# Patient Record
Sex: Female | Born: 1960 | Hispanic: No | Marital: Married | Smoking: Never smoker
Health system: Southern US, Community
[De-identification: ages and names within clinical notes are randomized; demographics above are authoritative.]

## PROBLEM LIST (undated history)

## (undated) ENCOUNTER — Ambulatory Visit (HOSPITAL_COMMUNITY): Disposition: A | Discharge status: Home / Self Care | Payer: PRIVATE HEALTH INSURANCE

## (undated) DIAGNOSIS — E785 Hyperlipidemia, unspecified: Secondary | ICD-10-CM

## (undated) DIAGNOSIS — I10 Essential (primary) hypertension: Secondary | ICD-10-CM

## (undated) DIAGNOSIS — E119 Type 2 diabetes mellitus without complications: Secondary | ICD-10-CM

## (undated) HISTORY — DX: Hyperlipidemia, unspecified: E78.5

## (undated) HISTORY — DX: Type 2 diabetes mellitus without complications: E11.9

---

## 2002-03-09 HISTORY — PX: ANKLE SURGERY: SHX546

## 2002-06-08 DIAGNOSIS — M810 Age-related osteoporosis without current pathological fracture: Secondary | ICD-10-CM | POA: Insufficient documentation

## 2017-01-27 ENCOUNTER — Ambulatory Visit: Payer: Self-pay | Attending: Nurse Practitioner | Admitting: Nurse Practitioner

## 2017-01-27 ENCOUNTER — Ambulatory Visit: Payer: Self-pay | Admitting: Nurse Practitioner

## 2017-01-27 ENCOUNTER — Encounter: Payer: Self-pay | Admitting: Nurse Practitioner

## 2017-01-27 VITALS — BP 106/71 | HR 66 | Temp 98.3°F | Resp 18 | Ht 68.0 in | Wt 242.0 lb

## 2017-01-27 DIAGNOSIS — E669 Obesity, unspecified: Secondary | ICD-10-CM | POA: Insufficient documentation

## 2017-01-27 DIAGNOSIS — E782 Mixed hyperlipidemia: Secondary | ICD-10-CM | POA: Insufficient documentation

## 2017-01-27 DIAGNOSIS — J309 Allergic rhinitis, unspecified: Secondary | ICD-10-CM | POA: Insufficient documentation

## 2017-01-27 DIAGNOSIS — Z7984 Long term (current) use of oral hypoglycemic drugs: Secondary | ICD-10-CM | POA: Insufficient documentation

## 2017-01-27 DIAGNOSIS — W2203XA Walked into furniture, initial encounter: Secondary | ICD-10-CM | POA: Insufficient documentation

## 2017-01-27 DIAGNOSIS — E119 Type 2 diabetes mellitus without complications: Secondary | ICD-10-CM

## 2017-01-27 DIAGNOSIS — E1169 Type 2 diabetes mellitus with other specified complication: Secondary | ICD-10-CM

## 2017-01-27 DIAGNOSIS — Z79899 Other long term (current) drug therapy: Secondary | ICD-10-CM | POA: Insufficient documentation

## 2017-01-27 DIAGNOSIS — Z833 Family history of diabetes mellitus: Secondary | ICD-10-CM | POA: Insufficient documentation

## 2017-01-27 DIAGNOSIS — I10 Essential (primary) hypertension: Secondary | ICD-10-CM | POA: Insufficient documentation

## 2017-01-27 DIAGNOSIS — Z6836 Body mass index (BMI) 36.0-36.9, adult: Secondary | ICD-10-CM | POA: Insufficient documentation

## 2017-01-27 DIAGNOSIS — E1165 Type 2 diabetes mellitus with hyperglycemia: Secondary | ICD-10-CM | POA: Insufficient documentation

## 2017-01-27 DIAGNOSIS — Z23 Encounter for immunization: Secondary | ICD-10-CM | POA: Insufficient documentation

## 2017-01-27 DIAGNOSIS — M79672 Pain in left foot: Secondary | ICD-10-CM | POA: Insufficient documentation

## 2017-01-27 LAB — POCT GLYCOSYLATED HEMOGLOBIN (HGB A1C): HEMOGLOBIN A1C: 10.2

## 2017-01-27 LAB — GLUCOSE, POCT (MANUAL RESULT ENTRY): POC GLUCOSE: 349 mg/dL — AB (ref 70–99)

## 2017-01-27 MED ORDER — GLIMEPIRIDE 2 MG PO TABS: 2.0000 mg | ORAL_TABLET | Freq: Every day | ORAL | 0 refills | Status: DC

## 2017-01-27 MED ORDER — FLUTICASONE PROPIONATE 50 MCG/ACT NA SUSP: 2.0000 | Freq: Every day | NASAL | 6 refills | Status: DC

## 2017-01-27 MED ORDER — LISINOPRIL 5 MG PO TABS: 5.0000 mg | ORAL_TABLET | Freq: Every day | ORAL | 0 refills | Status: DC

## 2017-01-27 MED ORDER — METFORMIN HCL 1000 MG PO TABS: 1000.0000 mg | ORAL_TABLET | Freq: Two times a day (BID) | ORAL | 2 refills | Status: DC

## 2017-01-27 MED ORDER — LISINOPRIL 5 MG PO TABS: 5.0000 mg | ORAL_TABLET | Freq: Every day | ORAL | 3 refills | Status: DC

## 2017-01-27 MED ORDER — GLIMEPIRIDE 1 MG PO TABS: 1.0000 mg | ORAL_TABLET | Freq: Every day | ORAL | 0 refills | Status: DC

## 2017-01-27 MED FILL — ?METFORMIN HCL 1,000 MG TAB: 1000 | 30 days supply | Qty: 60 | Fill #0

## 2017-01-27 MED FILL — ?LISINOPRIL 5 MG TABLET: 5 | 30 days supply | Qty: 30 | Fill #0

## 2017-01-27 MED FILL — ?GLIMEPIRIDE 2 MG TABLET: 2 | 30 days supply | Qty: 30 | Fill #0

## 2017-01-27 MED FILL — FLUTICASONE PROP 50 MCG SPR: 50 | 30 days supply | Qty: 16 | Fill #0

## 2017-01-27 NOTE — Progress Notes (Signed)
Assessment & Plan:  [name] was seen today for new patient (initial visit).  Diagnoses and all orders for this visit:  Type 2 diabetes mellitus without complication, without long-term current use of insulin (HCC) -     Glucose (CBG) -     HgB A1c -     Ambulatory referral to Ophthalmology -     CBC -     Microalbumin / creatinine urine ratio -     metFORMIN (GLUCOPHAGE) 1000 MG tablet; Take 1 tablet (1,000 mg total) by mouth 2 (two) times daily with a meal. -     glimepiride (AMARYL) 2 MG tablet; Take 1 tablet (2 mg total) by mouth daily with breakfast. Diabetes is poorly controlled. Advised patient to keep a fasting blood sugar log fast, 2 hours post lunch and bedtime which will be reviewed at the next office visit.   Essential hypertension -     CMP14+EGFR -     Microalbumin / creatinine urine ratio -     lisinopril (PRINIVIL,ZESTRIL) 5 MG tablet; Take 1 tablet (5 mg total) by mouth daily. Continue all antihypertensives as prescribed.  Remember to bring in your blood pressure log with you for your follow up appointment.  DASH/Mediterranean Diets are healthier choices for HTN.    Combined hyperlipidemia associated with type 2 diabetes mellitus ([place]) -     Lipid panel Exercise at least 150 minutes per week.   Allergic rhinitis, unspecified seasonality, unspecified trigger -     fluticasone (FLONASE) 50 MCG/ACT nasal spray; Place 2 sprays into both nostrils daily.  Needs flu shot -     Flu Vaccine QUAD 6+ mos PF IM (Fluarix Quad PF)  HEALTH MAINTENANCE Patient has been counseled on age-appropriate routine health concerns for screening and prevention. These are reviewed and up-to-date. Referrals have been placed accordingly. Immunizations are up-to-date or declined.    Ophthalmology referral placed Mammogram referral placed to [facility] Colonoscopy referral made PAP to be scheduled at next office visit Subjective:   Chief Complaint  Patient presents with  . New Patient  (Initial Visit)    Patient would like to establish care. Patient also would like to get medication for diabetes. Patient stated that she had diabetes and hypertension.   Christina Fields 56 y.o. female presents to office today to establish care as a new patient. She is from [place] and has been in the [place] for 4 months. She is accompanied by her husband today.  She endorses a history of DM, HTN and HPL. In regards to HPL she reports she was on medication for her "cholesterol levels" but she stopped taking it after she moved to the [place]. She has been taking '500mg'$  daily of her sister's metformin as she has been out of this as well. She endorses left foot pain(4th toe) today from hitting her foot against the bed several days ago.   Hypertension She is not exercising and is not adherent to low salt diet.  She does not have a blood pressure log today.  Blood pressure is well controlled at home.  Cardiac symptoms lower extremity edema (with prolonged standing). Patient denies chest pain, chest pressure/discomfort, dyspnea, exertional chest pressure/discomfort, irregular heart beat, orthopnea, palpitations and paroxysmal nocturnal dyspnea.  Cardiovascular risk factors: diabetes mellitus, dyslipidemia, hypertension and obesity (BMI >= 30 kg/m2). Use of agents associated with hypertension: none.  History of target organ damage: none. BP Readings from Last 3 Encounters: 01/27/17 : 106/71   Hyperlipidemia She is medication and diet compliant  and denies fatigue and syncope or myalgias.    Diabetes Mellitus Type II Current symptoms/problems include none and have been stable.  Known diabetic complications: none Cardiovascular risk factors: diabetes mellitus, dyslipidemia, hypertension and obesity (BMI >= 30 kg/m2) Current diabetic medications include:  Eye exam current (within one year): no Weight trend: stable Prior visit with dietician: no Current monitoring regimen: none Home blood sugar records: NONE Any  episodes of hypoglycemia? no Awaiting labs  Lab Results      Component                Value               Date                      HGBA1C                   10.2                01/27/2017                   Review of Systems  Constitutional: Negative for fever, malaise/fatigue and weight loss.  HENT: Negative.  Negative for nosebleeds.   Eyes: Negative.  Negative for blurred vision, double vision and photophobia.  Respiratory: Negative.  Negative for cough and shortness of breath.   Cardiovascular: Positive for leg swelling. Negative for chest pain and palpitations.  Gastrointestinal: Negative.  Negative for abdominal pain, constipation, diarrhea, heartburn, nausea and vomiting.  Musculoskeletal: Negative.  Negative for myalgias.  Neurological: Negative.  Negative for dizziness, focal weakness, seizures and headaches.  Endo/Heme/Allergies: Negative for environmental allergies.  Psychiatric/Behavioral: Negative.  Negative for suicidal ideas.    History reviewed. No pertinent past medical history.  Past Surgical History:  Procedure Laterality Date  . ANKLE SURGERY  2004    Family History  Problem Relation Age of Onset  . Diabetes Mother     Social History Reviewed with no changes to be made today.   Outpatient Medications Prior to Visit  Medication Sig Dispense Refill  . glimepiride (AMARYL) 1 MG tablet Take 1 mg by mouth daily with breakfast.    . metFORMIN (GLUCOPHAGE) 1000 MG tablet Take 1,000 mg by mouth 2 (two) times daily with a meal.     No facility-administered medications prior to visit.     Not on File     Objective:    BP 106/71 (BP Location: Left Arm, Patient Position: Sitting, Cuff Size: Normal)   Pulse 66   Temp 98.3 F (36.8 C)   Resp 18   Ht '5\' 8"'$  (1.727 m)   Wt 242 lb (109.8 kg)   SpO2 99%   BMI 36.80 kg/m  Wt Readings from Last 3 Encounters:  01/27/17 242 lb (109.8 kg)    Physical Exam  Constitutional: She is oriented to person,  place, and time. She appears well-developed and well-nourished. She is cooperative.  HENT:  Head: Normocephalic and atraumatic.  Eyes: EOM are normal.  Neck: Normal range of motion.  Cardiovascular: Normal rate, regular rhythm, normal heart sounds and intact distal pulses. Exam reveals no gallop and no friction rub.  No murmur heard. Pulmonary/Chest: Effort normal and breath sounds normal. No tachypnea. No respiratory distress. She has no decreased breath sounds. She has no wheezes. She has no rhonchi. She has no rales. She exhibits no tenderness.  Abdominal: Soft. Bowel sounds are normal.  Musculoskeletal: Normal range of motion. She exhibits  no edema.  Neurological: She is alert and oriented to person, place, and time. Coordination normal.  Skin: Skin is warm and dry.  Psychiatric: She has a normal mood and affect. Her behavior is normal. Judgment and thought content normal.  Nursing note and vitals reviewed.        Patient has been counseled extensively about nutrition and exercise as well as the importance of adherence with medications and regular follow-up. The patient was given clear instructions to go to ER or return to medical center if symptoms don't improve, worsen or new problems develop. The patient verbalized understanding.   Follow-up: Return in about 4 weeks (around 02/24/2017) for HTN/HPL/DM.   Gildardo Pounds, FNP-BC [facility] [place], [place]   01/27/2017, 9:46 PM

## 2017-01-27 NOTE — Patient Instructions (Addendum)
DASH Eating Plan DASH stands for "Dietary Approaches to Stop Hypertension." The DASH eating plan is a healthy eating plan that has been shown to reduce high blood pressure (hypertension). It may also reduce your risk for type 2 diabetes, heart disease, and stroke. The DASH eating plan may also help with weight loss. What are tips for following this plan? General guidelines  Avoid eating more than 2,300 mg (milligrams) of salt (sodium) a day. If you have hypertension, you may need to reduce your sodium intake to 1,500 mg a day.  Limit alcohol intake to no more than 1 drink a day for nonpregnant women and 2 drinks a day for men. One drink equals 12 oz of beer, 5 oz of wine, or 1 oz of hard liquor.  Work with your health care provider to maintain a healthy body weight or to lose weight. Ask what an ideal weight is for you.  Get at least 30 minutes of exercise that causes your heart to beat faster (aerobic exercise) most days of the week. Activities may include walking, swimming, or biking.  Work with your health care provider or diet and nutrition specialist (dietitian) to adjust your eating plan to your individual calorie needs. Reading food labels  Check food labels for the amount of sodium per serving. Choose foods with less than 5 percent of the Daily Value of sodium. Generally, foods with less than 300 mg of sodium per serving fit into this eating plan.  To find whole grains, look for the word "whole" as the first word in the ingredient list. Shopping  Buy products labeled as "low-sodium" or "no salt added."  Buy fresh foods. Avoid canned foods and premade or frozen meals. Cooking  Avoid adding salt when cooking. Use salt-free seasonings or herbs instead of table salt or sea salt. Check with your health care provider or pharmacist before using salt substitutes.  Do not fry foods. Cook foods using healthy methods such as baking, boiling, grilling, and broiling instead.  Cook with  heart-healthy oils, such as olive, canola, soybean, or sunflower oil. Meal planning   Eat a balanced diet that includes: ? 5 or more servings of fruits and vegetables each day. At each meal, try to fill half of your plate with fruits and vegetables. ? Up to 6-8 servings of whole grains each day. ? Less than 6 oz of lean meat, poultry, or fish each day. A 3-oz serving of meat is about the same size as a deck of cards. One egg equals 1 oz. ? 2 servings of low-fat dairy each day. ? A serving of nuts, seeds, or beans 5 times each week. ? Heart-healthy fats. Healthy fats called Omega-3 fatty acids are found in foods such as flaxseeds and coldwater fish, like sardines, salmon, and mackerel.  Limit how much you eat of the following: ? Canned or prepackaged foods. ? Food that is high in trans fat, such as fried foods. ? Food that is high in saturated fat, such as fatty meat. ? Sweets, desserts, sugary drinks, and other foods with added sugar. ? Full-fat dairy products.  Do not salt foods before eating.  Try to eat at least 2 vegetarian meals each week.  Eat more home-cooked food and less restaurant, buffet, and fast food.  When eating at a restaurant, ask that your food be prepared with less salt or no salt, if possible. What foods are recommended? The items listed may not be a complete list. Talk with your dietitian about what   dietary choices are best for you. Grains Whole-grain or whole-wheat bread. Whole-grain or whole-wheat pasta. Brown rice. [place]. Bulgur. Whole-grain and low-sodium cereals. Pita bread. Low-fat, low-sodium crackers. Whole-wheat flour tortillas. Vegetables Fresh or frozen vegetables (raw, steamed, roasted, or grilled). Low-sodium or reduced-sodium tomato and vegetable juice. Low-sodium or reduced-sodium tomato sauce and tomato paste. Low-sodium or reduced-sodium canned vegetables. Fruits All fresh, dried, or frozen fruit. Canned fruit in natural juice (without  added sugar). Meat and other protein foods Skinless chicken or [place]. Ground chicken or [place]. Pork with fat trimmed off. Fish and seafood. Egg whites. Dried beans, peas, or lentils. Unsalted nuts, nut butters, and seeds. Unsalted canned beans. Lean cuts of beef with fat trimmed off. Low-sodium, lean deli meat. Dairy Low-fat (1%) or fat-free (skim) milk. Fat-free, low-fat, or reduced-fat cheeses. Nonfat, low-sodium ricotta or cottage cheese. Low-fat or nonfat yogurt. Low-fat, low-sodium cheese. Fats and oils Soft margarine without trans fats. Vegetable oil. Low-fat, reduced-fat, or light mayonnaise and salad dressings (reduced-sodium). Canola, safflower, olive, soybean, and sunflower oils. Avocado. Seasoning and other foods Herbs. Spices. Seasoning mixes without salt. Unsalted popcorn and pretzels. Fat-free sweets. What foods are not recommended? The items listed may not be a complete list. Talk with your dietitian about what dietary choices are best for you. Grains Baked goods made with fat, such as croissants, muffins, or some breads. Dry pasta or rice meal packs. Vegetables Creamed or fried vegetables. Vegetables in a cheese sauce. Regular canned vegetables (not low-sodium or reduced-sodium). Regular canned tomato sauce and paste (not low-sodium or reduced-sodium). Regular tomato and vegetable juice (not low-sodium or reduced-sodium). Angie Fava. Olives. Fruits Canned fruit in a light or heavy syrup. Fried fruit. Fruit in cream or butter sauce. Meat and other protein foods Fatty cuts of meat. Ribs. Fried meat. Berniece Salines. Sausage. Bologna and other processed lunch meats. Salami. Fatback. Hotdogs. Bratwurst. Salted nuts and seeds. Canned beans with added salt. Canned or smoked fish. Whole eggs or egg yolks. Chicken or [place] with skin. Dairy Whole or 2% milk, cream, and half-and-half. Whole or full-fat cream cheese. Whole-fat or sweetened yogurt. Full-fat cheese. Nondairy creamers. Whipped toppings.  Processed cheese and cheese spreads. Fats and oils Butter. Stick margarine. Lard. Shortening. Ghee. Bacon fat. Tropical oils, such as coconut, palm kernel, or palm oil. Seasoning and other foods Salted popcorn and pretzels. Onion salt, garlic salt, seasoned salt, table salt, and sea salt. Worcestershire sauce. Tartar sauce. Barbecue sauce. Teriyaki sauce. Soy sauce, including reduced-sodium. Steak sauce. Canned and packaged gravies. Fish sauce. Oyster sauce. Cocktail sauce. Horseradish that you find on the shelf. Ketchup. Mustard. Meat flavorings and tenderizers. Bouillon cubes. Hot sauce and Tabasco sauce. Premade or packaged marinades. Premade or packaged taco seasonings. Relishes. Regular salad dressings. Where to find more information:  National Heart, Lung, and [place]: https://wilson-eaton.com/  American Heart Association: www.heart.org Summary  The DASH eating plan is a healthy eating plan that has been shown to reduce high blood pressure (hypertension). It may also reduce your risk for type 2 diabetes, heart disease, and stroke.  With the DASH eating plan, you should limit salt (sodium) intake to 2,300 mg a day. If you have hypertension, you may need to reduce your sodium intake to 1,500 mg a day.  When on the DASH eating plan, aim to eat more fresh fruits and vegetables, whole grains, lean proteins, low-fat dairy, and heart-healthy fats.  Work with your health care provider or diet and nutrition specialist (dietitian) to adjust your eating plan to your individual  calorie needs. This information is not intended to replace advice given to you by your health care provider. Make sure you discuss any questions you have with your health care provider. Document Released: 02/12/2011 Document Revised: 02/17/2016 Document Reviewed: 02/17/2016 Elsevier Interactive Patient Education  2017 [place].  Diabetes Mellitus and Standards of Medical Care Managing diabetes (diabetes mellitus) can be  complicated. Your diabetes treatment may be managed by a team of health care providers, including:  A diet and nutrition specialist (registered dietitian).  A nurse.  A certified diabetes educator (CDE).  A diabetes specialist (endocrinologist).  An eye doctor.  A primary care provider.  A dentist.  Your health care providers follow a schedule in order to help you get the best quality of care. The following schedule is a general guideline for your diabetes management plan. Your health care providers may also give you more specific instructions. HbA1c ( hemoglobin A1c) test This test provides information about blood sugar (glucose) control over the previous 2-3 months. It is used to check whether your diabetes management plan needs to be adjusted.  If you are meeting your treatment goals, this test is done at least 2 times a year.  If you are not meeting treatment goals or if your treatment goals have changed, this test is done 4 times a year.  Blood pressure test  This test is done at every routine medical visit. For most people, the goal is less than 130/80. Ask your health care provider what your goal blood pressure should be. Dental and eye exams  Visit your dentist two times a year.  If you have type 1 diabetes, get an eye exam 3-5 years after you are diagnosed, and then once a year after your first exam. ? If you were diagnosed with type 1 diabetes as a child, get an eye exam when you are age 64 or older and have had diabetes for 3-5 years. After the first exam, you should get an eye exam once a year.  If you have type 2 diabetes, have an eye exam as soon as you are diagnosed, and then once a year after your first exam. Foot care exam  Visual foot exams are done at every routine medical visit. The exams check for cuts, bruises, redness, blisters, sores, or other problems with the feet.  A complete foot exam is done by your health care provider once a year. This exam  includes an inspection of the structure and skin of your feet, and a check of the pulses and sensation in your feet. ? Type 1 diabetes: Get your first exam 3-5 years after diagnosis. ? Type 2 diabetes: Get your first exam as soon as you are diagnosed.  Check your feet every day for cuts, bruises, redness, blisters, or sores. If you have any of these or other problems that are not healing, contact your health care provider. Kidney function test ( urine microalbumin)  This test is done once a year. ? Type 1 diabetes: Get your first test 5 years after diagnosis. ? Type 2 diabetes: Get your first test as soon as you are diagnosed.  If you have chronic kidney disease (CKD), get a serum creatinine and estimated glomerular filtration rate (eGFR) test once a year. Lipid profile (cholesterol, HDL, LDL, triglycerides)  This test should be done when you are diagnosed with diabetes, and every 5 years after the first test. If you are on medicines to lower your cholesterol, you may need to get this test  done every year. ? The goal for LDL is less than 100 mg/dL (5.5 mmol/L). If you are at high risk, the goal is less than 70 mg/dL (3.9 mmol/L). ? The goal for HDL is 40 mg/dL (2.2 mmol/L) for men and 50 mg/dL(2.8 mmol/L) for women. An HDL cholesterol of 60 mg/dL (3.3 mmol/L) or higher gives some protection against heart disease. ? The goal for triglycerides is less than 150 mg/dL (8.3 mmol/L). Immunizations  The yearly flu (influenza) vaccine is recommended for everyone 6 months or older who has diabetes.  The pneumonia (pneumococcal) vaccine is recommended for everyone 2 years or older who has diabetes. If you are 55 or older, you may get the pneumonia vaccine as a series of two separate shots.  The hepatitis B vaccine is recommended for adults shortly after they have been diagnosed with diabetes.  The Tdap (tetanus, diphtheria, and pertussis) vaccine should be given: ? According to normal childhood  vaccination schedules, for children. ? Every 10 years, for adults who have diabetes.  The shingles vaccine is recommended for people who have had chicken pox and are 50 years or older. Mental and emotional health  Screening for symptoms of eating disorders, anxiety, and depression is recommended at the time of diagnosis and afterward as needed. If your screening shows that you have symptoms (you have a positive screening result), you may need further evaluation and be referred to a mental health care provider. Diabetes self-management education  Education about how to manage your diabetes is recommended at diagnosis and ongoing as needed. Treatment plan  Your treatment plan will be reviewed at every medical visit. Summary  Managing diabetes (diabetes mellitus) can be complicated. Your diabetes treatment may be managed by a team of health care providers.  Your health care providers follow a schedule in order to help you get the best quality of care.  Standards of care including having regular physical exams, blood tests, blood pressure monitoring, immunizations, screening tests, and education about how to manage your diabetes.  Your health care providers may also give you more specific instructions based on your individual health. This information is not intended to replace advice given to you by your health care provider. Make sure you discuss any questions you have with your health care provider. Document Released: 12/21/2008 Document Revised: 11/22/2015 Document Reviewed: 11/22/2015 Elsevier Interactive Patient Education  [employer].

## 2017-01-28 LAB — LIPID PANEL
CHOL/HDL RATIO: 4.7 ratio — AB (ref 0.0–4.4)
Cholesterol, Total: 230 mg/dL — ABNORMAL HIGH (ref 100–199)
HDL: 49 mg/dL (ref >39)
LDL Calculated: 128 mg/dL — ABNORMAL HIGH (ref 0–99)
Triglycerides: 263 mg/dL — ABNORMAL HIGH (ref 0–149)
VLDL Cholesterol Cal: 53 mg/dL — ABNORMAL HIGH (ref 5–40)

## 2017-01-28 LAB — CMP14+EGFR
A/G RATIO: 1.3 (ref 1.2–2.2)
ALBUMIN: 4 g/dL (ref 3.5–5.5)
ALT: 10 IU/L (ref 0–32)
AST: 11 IU/L (ref 0–40)
Alkaline Phosphatase: 88 IU/L (ref 39–117)
BILIRUBIN TOTAL: 0.2 mg/dL (ref 0.0–1.2)
BUN / CREAT RATIO: 27 — AB (ref 9–23)
BUN: 17 mg/dL (ref 6–24)
CALCIUM: 9.4 mg/dL (ref 8.7–10.2)
CHLORIDE: 102 mmol/L (ref 96–106)
CO2: 24 mmol/L (ref 20–29)
Creatinine, Ser: 0.62 mg/dL (ref 0.57–1.00)
GFR, EST AFRICAN AMERICAN: 117 mL/min/{1.73_m2} (ref >59)
GFR, EST NON AFRICAN AMERICAN: 101 mL/min/{1.73_m2} (ref >59)
GLOBULIN, TOTAL: 3.2 g/dL (ref 1.5–4.5)
Glucose: 266 mg/dL — ABNORMAL HIGH (ref 65–99)
POTASSIUM: 4.6 mmol/L (ref 3.5–5.2)
SODIUM: 138 mmol/L (ref 134–144)
TOTAL PROTEIN: 7.2 g/dL (ref 6.0–8.5)

## 2017-01-28 LAB — CBC
HEMATOCRIT: 38 % (ref 34.0–46.6)
HEMOGLOBIN: 12.9 g/dL (ref 11.1–15.9)
MCH: 29.1 pg (ref 26.6–33.0)
MCHC: 33.9 g/dL (ref 31.5–35.7)
MCV: 86 fL (ref 79–97)
Platelets: 213 10*3/uL (ref 150–379)
RBC: 4.43 x10E6/uL (ref 3.77–5.28)
RDW: 14.6 % (ref 12.3–15.4)
WBC: 6 10*3/uL (ref 3.4–10.8)

## 2017-01-28 LAB — MICROALBUMIN / CREATININE URINE RATIO
CREATININE, UR: 124.7 mg/dL
MICROALBUM., U, RANDOM: 7 ug/mL
Microalb/Creat Ratio: 5.6 mg/g creat (ref 0.0–30.0)

## 2017-02-01 ENCOUNTER — Other Ambulatory Visit: Payer: Self-pay | Admitting: Nurse Practitioner

## 2017-02-01 MED ORDER — ATORVASTATIN CALCIUM 20 MG PO TABS: 20.0000 mg | ORAL_TABLET | Freq: Every day | ORAL | 3 refills | Status: DC

## 2017-02-02 ENCOUNTER — Telehealth: Payer: Self-pay

## 2017-02-02 NOTE — Telephone Encounter (Signed)
-----   Message from Claiborne RiggZelda W Fleming, NP sent at 02/01/2017  8:30 PM EST ----- Tests show increased cholesterol/lipid levels. Will need to start on statin or cholesterol/lipid lowering medication. Prescription has been sent to the pharmacy. Patient should continue to work on low fat, heart healthy diet and participate in regular aerobic exercise program to control as well. All other labs are essentially normal.

## 2017-02-02 NOTE — Telephone Encounter (Signed)
Patient's cousin answer. He is on her emergency contact list. Patient's cousin is informed on lab result and cholesterol medication needs to be pick up.

## 2017-02-09 ENCOUNTER — Ambulatory Visit: Payer: Self-pay | Attending: Nurse Practitioner

## 2017-02-10 ENCOUNTER — Ambulatory Visit: Payer: Self-pay | Attending: Nurse Practitioner | Admitting: Nurse Practitioner

## 2017-02-10 ENCOUNTER — Encounter: Payer: Self-pay | Admitting: Nurse Practitioner

## 2017-02-10 VITALS — BP 127/82 | HR 81 | Temp 98.2°F | Ht 69.0 in | Wt 239.6 lb

## 2017-02-10 DIAGNOSIS — Z7984 Long term (current) use of oral hypoglycemic drugs: Secondary | ICD-10-CM | POA: Insufficient documentation

## 2017-02-10 DIAGNOSIS — J069 Acute upper respiratory infection, unspecified: Secondary | ICD-10-CM | POA: Insufficient documentation

## 2017-02-10 DIAGNOSIS — E119 Type 2 diabetes mellitus without complications: Secondary | ICD-10-CM | POA: Insufficient documentation

## 2017-02-10 DIAGNOSIS — R509 Fever, unspecified: Secondary | ICD-10-CM | POA: Insufficient documentation

## 2017-02-10 DIAGNOSIS — Z79899 Other long term (current) drug therapy: Secondary | ICD-10-CM | POA: Insufficient documentation

## 2017-02-10 DIAGNOSIS — B070 Plantar wart: Secondary | ICD-10-CM | POA: Insufficient documentation

## 2017-02-10 DIAGNOSIS — R6889 Other general symptoms and signs: Secondary | ICD-10-CM

## 2017-02-10 LAB — POCT URINALYSIS DIPSTICK
Bilirubin, UA: NEGATIVE
Glucose, UA: NEGATIVE
KETONES UA: NEGATIVE
Nitrite, UA: NEGATIVE
PH UA: 5 (ref 5.0–8.0)
Protein, UA: 5
Spec Grav, UA: 1.025 (ref 1.010–1.025)
Urobilinogen, UA: 0.2 E.U./dL

## 2017-02-10 LAB — GLUCOSE, POCT (MANUAL RESULT ENTRY)
POC GLUCOSE: 372 mg/dL — AB (ref 70–99)
POC GLUCOSE: 322 mg/dL — AB (ref 70–99)

## 2017-02-10 MED ORDER — IBUPROFEN 600 MG PO TABS: 600.0000 mg | ORAL_TABLET | Freq: Three times a day (TID) | ORAL | 0 refills | Status: DC | PRN

## 2017-02-10 MED ORDER — ACETAMINOPHEN 500 MG PO TABS: 500.0000 mg | ORAL_TABLET | Freq: Four times a day (QID) | ORAL | 0 refills | Status: DC | PRN

## 2017-02-10 MED ORDER — INSULIN ASPART 100 UNIT/ML ~~LOC~~ SOLN
20.0000 [IU] | Freq: Once | SUBCUTANEOUS | Status: AC
  Administered 2017-02-10: 20 [IU] via SUBCUTANEOUS

## 2017-02-10 MED ORDER — IMIQUIMOD 5 % EX CREA: TOPICAL_CREAM | CUTANEOUS | 1 refills | Status: DC

## 2017-02-10 MED FILL — IBUPROFEN 600 MG TABLET: 600 | 10 days supply | Qty: 30 | Fill #0

## 2017-02-10 NOTE — Progress Notes (Signed)
poct

## 2017-02-10 NOTE — Progress Notes (Signed)
Assessment & Plan:  Christina Fields was seen today for fever.  Diagnoses and all orders for this visit:  Flu-like symptoms -     Respiratory virus panel -     ibuprofen (ADVIL,MOTRIN) 600 MG tablet; Take 1 tablet (600 mg total) by mouth every 8 (eight) hours as needed. -     acetaminophen (TYLENOL) 500 MG tablet; Take 1 tablet (500 mg total) by mouth every 6 (six) hours as needed. INSTRUCTIONS: Stay hydrated and drink plenty of fluids, eat small frequent healthy snacks or meals if able to tolerate.   Plantar wart of right foot -     imiquimod (ALDARA) 5 % cream; Apply topically 3 (three) times a week. -     Ambulatory referral to Podiatry  Type 2 diabetes mellitus without complication, without long-term current use of insulin (HCC) -     Glucose (CBG) -     insulin aspart (novoLOG) injection 20 Units -     Urinalysis Dipstick   Patient has been counseled on age-appropriate routine health concerns for screening and prevention. These are reviewed and up-to-date. Referrals have been placed accordingly. Immunizations are up-to-date or declined.   She will obtain her PNA vaccine at her next office visit.   Subjective:   Chief Complaint  Patient presents with  . Fever    Patient stated shes having headaches, fever, chills, body aches, pain in joints, and nose is stuffy. Patient stated she started to feel these symptoms on Monday.    HPI Christina Fields 56 y.o. female presents to office today with concerns of URI.  Upper Respiratory Infection Patient complains of symptoms of a URI. Her husband was sick for one day. Symptoms include headache, body aches,  congestion, cough, fever and decreased appetite.  . Onset of symptoms was 2 days ago, unchanged since that time. She also c/o achiness and headache described as ache. for the past 2 days .  She is not drinking much. Evaluation to date: none. Treatment to date: "a sedative" but she can not recall the name of it. She has a PMH of poorly  controlled DMII, HTN and HPL. She has not taken her metformin today or yesterday due to illness.  She is hyperglycemic today with no ketonuria.   Review of Systems  Constitutional: Positive for chills, fever and malaise/fatigue.  HENT: Positive for congestion. Negative for ear pain, hearing loss, nosebleeds, sinus pain and sore throat.   Eyes: Negative.   Respiratory: Positive for cough. Negative for hemoptysis, sputum production, shortness of breath and wheezing.   Cardiovascular: Negative.  Negative for chest pain, orthopnea and leg swelling.  Gastrointestinal: Negative for abdominal pain, constipation, diarrhea, heartburn, nausea and vomiting.  Musculoskeletal: Positive for myalgias.  Neurological: Positive for weakness and headaches. Negative for sensory change and focal weakness.  Psychiatric/Behavioral: Negative.     History reviewed. No pertinent past medical history.  Past Surgical History:  Procedure Laterality Date  . ANKLE SURGERY  2004    Family History  Problem Relation Age of Onset  . Diabetes Mother     Social History Reviewed with no changes to be made today.   Outpatient Medications Prior to Visit  Medication Sig Dispense Refill  . atorvastatin (LIPITOR) 20 MG tablet Take 1 tablet (20 mg total) by mouth daily. 90 tablet 3  . fluticasone (FLONASE) 50 MCG/ACT nasal spray Place 2 sprays into both nostrils daily. 16 g 6  . glimepiride (AMARYL) 2 MG tablet Take 1 tablet (2 mg total) by  mouth daily with breakfast. 90 tablet 0  . lisinopril (PRINIVIL,ZESTRIL) 5 MG tablet Take 1 tablet (5 mg total) by mouth daily. 90 tablet 0  . metFORMIN (GLUCOPHAGE) 1000 MG tablet Take 1 tablet (1,000 mg total) by mouth 2 (two) times daily with a meal. 60 tablet 2   No facility-administered medications prior to visit.     No Known Allergies     Objective:    BP 127/82 (BP Location: Right Arm, Patient Position: Sitting, Cuff Size: Normal)   Pulse 81   Temp 98.2 F (36.8 C)  (Oral)   Ht 5\' 9"  (1.753 m)   Wt 239 lb 9.6 oz (108.7 kg)   SpO2 96%   BMI 35.38 kg/m  Wt Readings from Last 3 Encounters:  02/10/17 239 lb 9.6 oz (108.7 kg)  01/27/17 242 lb (109.8 kg)    Physical Exam  Constitutional: She is oriented to person, place, and time. She appears well-developed and well-nourished. She is cooperative. She has a sickly appearance.  HENT:  Head: Normocephalic and atraumatic.  Nose: Mucosal edema and rhinorrhea present. Right sinus exhibits no maxillary sinus tenderness and no frontal sinus tenderness. Left sinus exhibits no maxillary sinus tenderness and no frontal sinus tenderness.  Mouth/Throat: Uvula is midline, oropharynx is clear and moist and mucous membranes are normal. No oropharyngeal exudate, posterior oropharyngeal edema, posterior oropharyngeal erythema or tonsillar abscesses.  Eyes: EOM are normal.  Neck: Normal range of motion.  Cardiovascular: Normal rate, regular rhythm, normal heart sounds and intact distal pulses. Exam reveals no gallop and no friction rub.  No murmur heard. Pulmonary/Chest: Effort normal and breath sounds normal. No tachypnea. No respiratory distress. She has no decreased breath sounds. She has no wheezes. She has no rhonchi. She has no rales. She exhibits no tenderness.  Abdominal: Soft. Bowel sounds are normal.  Musculoskeletal: Normal range of motion. She exhibits no edema.       Feet:  Plantar wart appearing lesions on sole of right foot. Areas are tender with firm palpation. There are no visible signs of infection.   Neurological: She is alert and oriented to person, place, and time. Coordination normal.  Skin: Skin is warm and dry.  Sensory exam of the foot is normal, tested with the monofilament. There are several calloused areas near both heels however no ulcerated lesions observed.  Good peripheral pulses.  Psychiatric: She has a normal mood and affect. Her behavior is normal. Judgment and thought content normal.    Nursing note and vitals reviewed.      Patient has been counseled extensively about nutrition and exercise as well as the importance of adherence with medications and regular follow-up. The patient was given clear instructions to go to ER or return to medical center if symptoms don't improve, worsen or new problems develop. The patient verbalized understanding.   Follow-up: Return if symptoms worsen or fail to improve, for Patient has a follow up appointment scheduled for 3 months.   Claiborne RiggZelda W Fleming, FNP-BC [facility] [place], [place] [phone]   02/10/2017, 10:32 AM

## 2017-02-12 LAB — RESPIRATORY VIRUS PANEL
ADENOVIRUS: NEGATIVE
INFLUENZA A: NEGATIVE
INFLUENZA B 1: NEGATIVE
Metapneumovirus: NEGATIVE
Parainfluenza 1: NEGATIVE
Parainfluenza 2: NEGATIVE
Parainfluenza 3: NEGATIVE
RESPIRATORY SYNCYTIAL VIRUS A: NEGATIVE
RESPIRATORY SYNCYTIAL VIRUS B: NEGATIVE
RHINOVIRUS: NEGATIVE

## 2017-02-17 ENCOUNTER — Telehealth: Payer: Self-pay

## 2017-02-17 NOTE — Telephone Encounter (Signed)
-----   Message from Zelda W Fleming, NP sent at 02/14/2017 12:54 AM EST ----- Labwork is negative for the flu virus 

## 2017-02-17 NOTE — Telephone Encounter (Signed)
Patient informed on the lab result.   Patient verified DOB.

## 2017-02-17 NOTE — Telephone Encounter (Signed)
-----   Message from Claiborne RiggZelda W Fleming, NP sent at 02/14/2017 12:54 AM EST ----- [facility]renceLabwork is negative for the flu virus

## 2017-03-03 MED FILL — ?LISINOPRIL 5 MG TABLET: 5 | 30 days supply | Qty: 30 | Fill #1

## 2017-03-03 MED FILL — ?GLIMEPIRIDE 2 MG TABLET: 2 | 30 days supply | Qty: 30 | Fill #1

## 2017-03-03 MED FILL — ?METFORMIN HCL 1,000 MG TAB: 1000 | 30 days supply | Qty: 60 | Fill #1

## 2017-03-18 ENCOUNTER — Telehealth: Payer: Self-pay | Admitting: Nurse Practitioner

## 2017-03-18 DIAGNOSIS — J309 Allergic rhinitis, unspecified: Secondary | ICD-10-CM

## 2017-03-18 DIAGNOSIS — I10 Essential (primary) hypertension: Secondary | ICD-10-CM

## 2017-03-18 DIAGNOSIS — E119 Type 2 diabetes mellitus without complications: Secondary | ICD-10-CM

## 2017-03-18 MED ORDER — GLIMEPIRIDE 2 MG PO TABS: 2.0000 mg | ORAL_TABLET | Freq: Every day | ORAL | 0 refills | Status: DC

## 2017-03-18 MED ORDER — FLUTICASONE PROPIONATE 50 MCG/ACT NA SUSP: 2.0000 | Freq: Every day | NASAL | 0 refills | Status: DC

## 2017-03-18 MED ORDER — ATORVASTATIN CALCIUM 20 MG PO TABS: 20.0000 mg | ORAL_TABLET | Freq: Every day | ORAL | 0 refills | Status: DC

## 2017-03-18 MED ORDER — METFORMIN HCL 1000 MG PO TABS: 1000.0000 mg | ORAL_TABLET | Freq: Two times a day (BID) | ORAL | 0 refills | Status: DC

## 2017-03-18 MED ORDER — LISINOPRIL 5 MG PO TABS: 5.0000 mg | ORAL_TABLET | Freq: Every day | ORAL | 0 refills | Status: DC

## 2017-03-18 MED FILL — LISINOPRIL 5 MG TAB: 5 | 60 days supply | Qty: 60 | Fill #0

## 2017-03-18 MED FILL — FLUTICASONE PROP 50 MCG SPR: 50 | 30 days supply | Qty: 16 | Fill #0

## 2017-03-18 MED FILL — metFORMIN HCL 1000 MG TABS: 1000 | 60 days supply | Qty: 120 | Fill #0

## 2017-03-18 MED FILL — ?ATORVASTATIN 20MG TABLET: 20 | 30 days supply | Qty: 30 | Fill #0

## 2017-03-18 MED FILL — GLIMEPIRIDE 2 MG TABLET: 2 | 60 days supply | Qty: 60 | Fill #0

## 2017-03-18 NOTE — Telephone Encounter (Signed)
Refilled for 90 day supply 

## 2017-03-18 NOTE — Telephone Encounter (Signed)
Patient requested for a 3 month supply of ALL medications so she able to travel out of country and have medication supply.

## 2017-04-30 ENCOUNTER — Ambulatory Visit: Payer: Self-pay | Admitting: Nurse Practitioner

## 2017-05-28 ENCOUNTER — Telehealth: Payer: Self-pay | Admitting: Nurse Practitioner

## 2017-05-28 ENCOUNTER — Other Ambulatory Visit: Payer: Self-pay | Admitting: Nurse Practitioner

## 2017-05-28 DIAGNOSIS — E119 Type 2 diabetes mellitus without complications: Secondary | ICD-10-CM

## 2017-05-28 DIAGNOSIS — I10 Essential (primary) hypertension: Secondary | ICD-10-CM

## 2017-05-28 MED ORDER — LISINOPRIL 5 MG PO TABS: 5.0000 mg | ORAL_TABLET | Freq: Every day | ORAL | 0 refills | Status: DC

## 2017-05-28 MED ORDER — GLIMEPIRIDE 2 MG PO TABS: 2.0000 mg | ORAL_TABLET | Freq: Every day | ORAL | 0 refills | Status: DC

## 2017-05-28 MED ORDER — METFORMIN HCL 1000 MG PO TABS: 1000.0000 mg | ORAL_TABLET | Freq: Two times a day (BID) | ORAL | 0 refills | Status: DC

## 2017-05-28 MED ORDER — GABAPENTIN 100 MG PO CAPS: 100.0000 mg | ORAL_CAPSULE | Freq: Three times a day (TID) | ORAL | 3 refills | Status: DC

## 2017-05-28 MED FILL — GLIMEPIRIDE 2 MG TABLET: 2 | 30 days supply | Qty: 30 | Fill #1

## 2017-05-28 MED FILL — GABAPENTIN 100 MG CAPSULE: 100 | 30 days supply | Qty: 90 | Fill #0

## 2017-05-28 MED FILL — metFORMIN HCL 1000 MG TABS: 1000 | 30 days supply | Qty: 60 | Fill #1

## 2017-05-28 MED FILL — LISINOPRIL 5 MG TAB: 5 | 30 days supply | Qty: 30 | Fill #1

## 2017-05-28 NOTE — Telephone Encounter (Signed)
Pt ask if she can get Metformin, Lisinopril, Glimepiride refill.  Last seen with PCP: 02/07/17 Next Ov: 07/06/17

## 2017-05-28 NOTE — Telephone Encounter (Signed)
Pt came in to request a refill on her BP medications and [facility]

## 2017-06-03 ENCOUNTER — Other Ambulatory Visit: Payer: Self-pay | Admitting: Nurse Practitioner

## 2017-06-03 NOTE — Telephone Encounter (Signed)
Refill has been sent.  °

## 2017-06-29 MED FILL — ATORVASTATIN 20 MG TABLET: 20 | 30 days supply | Qty: 30 | Fill #1

## 2017-06-29 MED FILL — metFORMIN HCL 1000 MG TABS: 1000 | 90 days supply | Qty: 180 | Fill #0

## 2017-06-29 MED FILL — GABAPENTIN 100 MG CAPSULE: 100 | 30 days supply | Qty: 90 | Fill #1

## 2017-06-29 MED FILL — LISINOPRIL 5 MG TAB: 5 | 90 days supply | Qty: 90 | Fill #0

## 2017-06-29 MED FILL — GLIMEPIRIDE 2 MG TABS: 2 | 90 days supply | Qty: 90 | Fill #0

## 2017-07-06 ENCOUNTER — Encounter: Payer: Self-pay | Admitting: Nurse Practitioner

## 2017-07-06 ENCOUNTER — Ambulatory Visit: Payer: Self-pay | Attending: Nurse Practitioner | Admitting: Nurse Practitioner

## 2017-07-06 VITALS — BP 143/90 | HR 75 | Temp 98.8°F | Resp 14 | Ht 69.0 in | Wt 238.2 lb

## 2017-07-06 DIAGNOSIS — E119 Type 2 diabetes mellitus without complications: Secondary | ICD-10-CM | POA: Insufficient documentation

## 2017-07-06 DIAGNOSIS — Z79899 Other long term (current) drug therapy: Secondary | ICD-10-CM | POA: Insufficient documentation

## 2017-07-06 DIAGNOSIS — Z1211 Encounter for screening for malignant neoplasm of colon: Secondary | ICD-10-CM

## 2017-07-06 DIAGNOSIS — I10 Essential (primary) hypertension: Secondary | ICD-10-CM | POA: Insufficient documentation

## 2017-07-06 DIAGNOSIS — R05 Cough: Secondary | ICD-10-CM | POA: Insufficient documentation

## 2017-07-06 DIAGNOSIS — Z833 Family history of diabetes mellitus: Secondary | ICD-10-CM | POA: Insufficient documentation

## 2017-07-06 DIAGNOSIS — E1169 Type 2 diabetes mellitus with other specified complication: Secondary | ICD-10-CM | POA: Insufficient documentation

## 2017-07-06 DIAGNOSIS — Z760 Encounter for issue of repeat prescription: Secondary | ICD-10-CM | POA: Insufficient documentation

## 2017-07-06 DIAGNOSIS — E782 Mixed hyperlipidemia: Secondary | ICD-10-CM | POA: Insufficient documentation

## 2017-07-06 DIAGNOSIS — Z7984 Long term (current) use of oral hypoglycemic drugs: Secondary | ICD-10-CM | POA: Insufficient documentation

## 2017-07-06 LAB — POCT GLYCOSYLATED HEMOGLOBIN (HGB A1C): Hemoglobin A1C: 6.9

## 2017-07-06 LAB — GLUCOSE, POCT (MANUAL RESULT ENTRY): POC Glucose: 179 mg/dl — AB (ref 70–99)

## 2017-07-06 MED ORDER — BENZONATATE 100 MG PO CAPS: 100.0000 mg | ORAL_CAPSULE | Freq: Two times a day (BID) | ORAL | 0 refills | Status: DC | PRN

## 2017-07-06 MED ORDER — TRUEPLUS LANCETS 28G MISC: 3 refills | Status: DC

## 2017-07-06 MED ORDER — GLUCOSE BLOOD VI STRP: ORAL_STRIP | 12 refills | Status: DC

## 2017-07-06 MED ORDER — TRUE METRIX METER W/DEVICE KIT
PACK | 0 refills | Status: DC
Start: 2017-07-06 — End: 2018-11-22

## 2017-07-06 MED FILL — !TRUE METRIX BLOOD GLUCOSE: 30 days supply | Qty: 1 | Fill #0

## 2017-07-06 MED FILL — TRUEplus LANCETS 28G MISC: 30 days supply | Qty: 100 | Fill #0

## 2017-07-06 MED FILL — TRUE METRIX TEST STRIP: 30 days supply | Qty: 100 | Fill #0

## 2017-07-06 NOTE — Patient Instructions (Signed)
Diabetes blood sugar goals  Fasting in AM before breakfast which means at least 8 hrs of no eating or drinking) except water or unsweetened coffee or tea): 90-110 2 hrs after meals: < 160,   Hypoglycemia or low blood sugar: < 70 (You should not have hypoglycemia.)  Aim for 30 minutes of exercise most days. Rethink what you drink. Water is great! Aim for 2-3 Carb Choices per meal (30-45 grams) +/- 1 either way  Aim for 0-15 Carbs per snack if hungry  Include protein in moderation with your meals and snacks  Consider reading food labels for Total Carbohydrate and Fat Grams of foods  Consider checking BG at alternate times per day  Continue taking medication as directed Be mindful about how much sugar you are adding to beverages and other foods. Fruit Punch - find one with no sugar  Measure and decrease portions of carbohydrate foods  Make your plate and don't go back for seconds 

## 2017-07-06 NOTE — Progress Notes (Signed)
Assessment & Plan:  Christina Fields was seen today for follow-up, medication refill and cough.  Diagnoses and all orders for this visit:  Type 2 diabetes mellitus without complication, without long-term current use of insulin (HCC) -     HgB A1c -     Glucose (CBG) -     glucose blood (TRUE METRIX BLOOD GLUCOSE TEST) test strip; Use as instructed -     TRUEPLUS LANCETS 28G MISC; Use as instructed -     Blood Glucose Monitoring Suppl (TRUE METRIX METER) w/Device KIT; Use as instructed Continue blood sugar control as discussed in office today, low carbohydrate diet, and regular physical exercise as tolerated, 150 minutes per week (30 min each day, 5 days per week, or 50 min 3 days per week). Keep blood sugar logs with fasting goal of 80-130 mg/dl, post prandial less than 180.  For Hypoglycemia: BS <60 and Hyperglycemia BS >400; contact the clinic ASAP. Annual eye exams and foot exams are recommended.   Combined hyperlipidemia associated with type 2 diabetes mellitus ([place]) INSTRUCTIONS: Work on a low fat, heart healthy diet and participate in regular aerobic exercise program by working out at least 150 minutes per week. No fried foods. No junk foods, sodas, sugary drinks, unhealthy snacking, alcohol or smoking.    Essential hypertension Continue all antihypertensives as prescribed.  Remember to bring in your blood pressure log with you for your follow up appointment.  DASH/Mediterranean Diets are healthier choices for HTN.    Colon cancer screening -     Ambulatory referral to Gastroenterology  Other orders -     benzonatate (TESSALON) 100 MG capsule; Take 1 capsule (100 mg total) by mouth 2 (two) times daily as needed for cough.    Patient has been counseled on age-appropriate routine health concerns for screening and prevention. These are reviewed and up-to-date. Referrals have been placed accordingly. Immunizations are up-to-date or declined.    Subjective:   Chief Complaint    Patient presents with  . Follow-up    Patient is here to follow-up on diabetes.   . Medication Refill  . Cough    Pt. think she may have a cold and started coughing for 5 days, face feel congested.    HPI Christina Fields 57 y.o. female presents to office today for follow up for diabetes mellitus type 2, essential hypertension and hyperlipidemia.  She is accompanied by her husband today. VRI was used to communicate directly with patient for the entire encounter including providing detailed patient instructions.   Diabetes Mellitus Type 2 Chronic and much improved. A1c down from 10.2 to 6.9. She has been medication compliant compared to non adherence with daily medication administration in the past. She does not have a glucometer. Will order for her today. She is eating more vegetables and less fat as well as losing weight. She denies any hypo or hyperglycemic symptoms. She is due for eye exam.  Lab Results  Component Value Date   HGBA1C 6.9 07/06/2017     Essential Hypertension Chronic. Normally well controlled however she is experiencing cold symptoms with persistent cough over the past several days and blood pressure is slightly elevated today. Will not make any changes to lisinopril at this time. Denies chest pain, shortness of breath, palpitations, lightheadedness, dizziness, headaches or BLE edema.  BP Readings from Last 3 Encounters:  07/06/17 (!) 143/90  02/10/17 127/82  01/27/17 106/71   Cough Patient complains of productive cough (yellowish), congestion (nasal at night), runny nose,  itchy throatSymptoms began 6 days ago.  The cough is without wheezing, dyspnea or hemoptysis and is aggravated by  heat .  Patient does not have new pets. Patient does not have a history of asthma. Patient does not have a history of environmental allergens. Patient did not have recent travel. Patient does not have a history of smoking. Patient  does not have previous Chest X-ray. Patient does not have  had a PPD done. She has been using herbal teas which provide moderate relief of cough.    Review of Systems  Constitutional: Negative for fever, malaise/fatigue and weight loss.  HENT: Positive for congestion, hearing loss (bilateral (chronic)) and sore throat. Negative for nosebleeds.        SEE HPI  Eyes: Negative.  Negative for blurred vision, double vision and photophobia.  Respiratory: Positive for cough and sputum production. Negative for shortness of breath.   Cardiovascular: Negative.  Negative for chest pain, palpitations and leg swelling.  Gastrointestinal: Negative.  Negative for heartburn, nausea and vomiting.  Musculoskeletal: Negative.  Negative for myalgias.  Neurological: Negative.  Negative for dizziness, focal weakness, seizures and headaches.  Psychiatric/Behavioral: Negative.  Negative for suicidal ideas.    Past Medical History:  Diagnosis Date  . Diabetes mellitus without complication [facility])     Past Surgical History:  Procedure Laterality Date  . ANKLE SURGERY  2004    Family History  Problem Relation Age of Onset  . Diabetes Mother     Social History Reviewed with no changes to be made today.   Outpatient Medications Prior to Visit  Medication Sig Dispense Refill  . atorvastatin (LIPITOR) 20 MG tablet Take 1 tablet (20 mg total) by mouth daily. 90 tablet 0  . glimepiride (AMARYL) 2 MG tablet Take 1 tablet (2 mg total) by mouth daily with breakfast. 90 tablet 0  . ibuprofen (ADVIL,MOTRIN) 600 MG tablet Take 1 tablet (600 mg total) by mouth every 8 (eight) hours as needed. 30 tablet 0  . lisinopril (PRINIVIL,ZESTRIL) 5 MG tablet Take 1 tablet (5 mg total) by mouth daily. 90 tablet 0  . acetaminophen (TYLENOL) 500 MG tablet Take 1 tablet (500 mg total) by mouth every 6 (six) hours as needed. (Patient not taking: Reported on 07/06/2017) 30 tablet 0  . fluticasone (FLONASE) 50 MCG/ACT nasal spray Place 2 sprays into both nostrils daily. (Patient not taking:  Reported on 07/06/2017) 48 g 0  . gabapentin (NEURONTIN) 100 MG capsule Take 1 capsule (100 mg total) by mouth 3 (three) times daily. (Patient not taking: Reported on 07/06/2017) 90 capsule 3  . imiquimod (ALDARA) 5 % cream Apply topically 3 (three) times a week. (Patient not taking: Reported on 07/06/2017) 12 each 1  . metFORMIN (GLUCOPHAGE) 1000 MG tablet Take 1 tablet (1,000 mg total) by mouth 2 (two) times daily with a meal. 180 tablet 0   No facility-administered medications prior to visit.     No Known Allergies     Objective:    BP (!) 143/90 (BP Location: Left Arm, Patient Position: Sitting, Cuff Size: Large)   Pulse 75   Temp 98.8 F (37.1 C) (Oral)   Resp 14   Ht '5\' 9"'$  (1.753 m)   Wt 238 lb 3.2 oz (108 kg)   SpO2 99%   BMI 35.18 kg/m  Wt Readings from Last 3 Encounters:  07/06/17 238 lb 3.2 oz (108 kg)  02/10/17 239 lb 9.6 oz (108.7 kg)  01/27/17 242 lb (109.8 kg)  Physical Exam  Constitutional: She is oriented to person, place, and time. She appears well-developed and well-nourished. She is cooperative.  HENT:  Head: Normocephalic and atraumatic.  Right Ear: Decreased hearing is noted.  Left Ear: Decreased hearing is noted.  Nose: Mucosal edema and rhinorrhea present. Right sinus exhibits no maxillary sinus tenderness and no frontal sinus tenderness. Left sinus exhibits no maxillary sinus tenderness and no frontal sinus tenderness.  Eyes: EOM are normal.  Neck: Normal range of motion.  Cardiovascular: Normal rate, regular rhythm, normal heart sounds and intact distal pulses. Exam reveals no gallop and no friction rub.  No murmur heard. Pulmonary/Chest: Effort normal and breath sounds normal. No tachypnea. No respiratory distress. She has no decreased breath sounds. She has no wheezes. She has no rhonchi. She has no rales. She exhibits no tenderness.  Abdominal: Soft. Bowel sounds are normal.  Musculoskeletal: Normal range of motion. She exhibits no edema.    Neurological: She is alert and oriented to person, place, and time. Coordination normal.  Skin: Skin is warm and dry.  Psychiatric: She has a normal mood and affect. Her behavior is normal. Judgment and thought content normal.  Nursing note and vitals reviewed.        Patient has been counseled extensively about nutrition and exercise as well as the importance of adherence with medications and regular follow-up. The patient was given clear instructions to go to ER or return to medical center if symptoms don't improve, worsen or new problems develop. The patient verbalized understanding.   Follow-up: Return in about 3 months (around 10/05/2017) for HTN/HPL/DM.   Gildardo Pounds, FNP-BC [facility] [place], [place]   07/06/2017, 1:00 PM

## 2017-07-07 ENCOUNTER — Ambulatory Visit: Payer: Self-pay | Admitting: Nurse Practitioner

## 2017-07-20 ENCOUNTER — Ambulatory Visit: Payer: Self-pay | Attending: Family Medicine

## 2017-08-30 ENCOUNTER — Encounter: Payer: Self-pay | Admitting: Nurse Practitioner

## 2017-08-30 ENCOUNTER — Ambulatory Visit: Payer: Self-pay | Attending: Nurse Practitioner | Admitting: Nurse Practitioner

## 2017-08-30 VITALS — BP 131/84 | HR 62 | Temp 98.3°F | Ht 69.0 in | Wt 238.0 lb

## 2017-08-30 DIAGNOSIS — M79642 Pain in left hand: Secondary | ICD-10-CM

## 2017-08-30 DIAGNOSIS — Z7951 Long term (current) use of inhaled steroids: Secondary | ICD-10-CM | POA: Insufficient documentation

## 2017-08-30 DIAGNOSIS — Z79899 Other long term (current) drug therapy: Secondary | ICD-10-CM | POA: Insufficient documentation

## 2017-08-30 DIAGNOSIS — E119 Type 2 diabetes mellitus without complications: Secondary | ICD-10-CM | POA: Insufficient documentation

## 2017-08-30 DIAGNOSIS — Z7984 Long term (current) use of oral hypoglycemic drugs: Secondary | ICD-10-CM | POA: Insufficient documentation

## 2017-08-30 DIAGNOSIS — M7642 Tibial collateral bursitis [Pellegrini-Stieda], left leg: Secondary | ICD-10-CM | POA: Insufficient documentation

## 2017-08-30 DIAGNOSIS — M25532 Pain in left wrist: Secondary | ICD-10-CM | POA: Insufficient documentation

## 2017-08-30 LAB — GLUCOSE, POCT (MANUAL RESULT ENTRY): POC GLUCOSE: 178 mg/dL — AB (ref 70–99)

## 2017-08-30 MED ORDER — GLIMEPIRIDE 2 MG PO TABS: 2.0000 mg | ORAL_TABLET | Freq: Every day | ORAL | 0 refills | Status: DC

## 2017-08-30 MED ORDER — METFORMIN HCL 1000 MG PO TABS
1000.0000 mg | ORAL_TABLET | Freq: Two times a day (BID) | ORAL | 0 refills | Status: DC
Start: 2017-08-30 — End: 2018-03-28

## 2017-08-30 MED ORDER — NAPROXEN 500 MG PO TABS: 500.0000 mg | ORAL_TABLET | Freq: Two times a day (BID) | ORAL | 1 refills | Status: DC

## 2017-08-30 MED FILL — NAPROXEN 500 MG TABLET: 500 | 30 days supply | Qty: 60 | Fill #0

## 2017-08-30 MED FILL — ?ATORVASTATIN 20 MG TABLET: 20 | 30 days supply | Qty: 30 | Fill #2

## 2017-08-30 NOTE — Patient Instructions (Addendum)
Patient has a xray ordered of her left wrist. Please show her how to get to radiology.   Hand Pain Many things can cause hand pain. Some common causes are:  An injury.  Repeating the same movement with your hand over and over (overuse).  Osteoporosis.  Arthritis.  Lumps in the tendons or joints of the hand and wrist (ganglion cysts).  Infection.  Follow these instructions at home: Pay attention to any changes in your symptoms. Take these actions to help with your discomfort:  If directed, put ice on the affected area: ? Put ice in a plastic bag. ? Place a towel between your skin and the bag. ? Leave the ice on for 15-20 minutes, 3?4 times a day for 2 days.  Take over-the-counter and prescription medicines only as told by your health care provider.  Minimize stress on your hands and wrists as much as possible.  Take breaks from repetitive activity often.  Do stretches as told by your health care provider.  Do not do activities that make your pain worse.  Contact a health care provider if:  Your pain does not get better after a few days of self-care.  Your pain gets worse.  Your pain affects your ability to do your daily activities. Get help right away if:  Your hand becomes warm, red, or swollen.  Your hand is numb or tingling.  Your hand is extremely swollen or deformed.  Your hand or fingers turn white or blue.  You cannot move your hand, wrist, or fingers. This information is not intended to replace advice given to you by your health care provider. Make sure you discuss any questions you have with your health care provider. Document Released: 03/22/2015 Document Revised: 08/01/2015 Document Reviewed: 03/21/2014 Elsevier Interactive Patient Education  [employer]2018 [employer]

## 2017-08-30 NOTE — Progress Notes (Signed)
Assessment & Plan:  Christina Fields was seen today for wrist pain.  Diagnoses and all orders for this visit:  Left hand pain -     DG Wrist Complete Left; Future -     Uric Acid -     naproxen (NAPROSYN) 500 MG tablet; Take 1 tablet (500 mg total) by mouth 2 (two) times daily with a meal. Splint wrist. Apply ice to affected area. May alternate with tylenol for pain.   Type 2 diabetes mellitus without complication, without long-term current use of insulin (HCC) -     Glucose (CBG) Continue blood sugar control as discussed in office today, low carbohydrate diet, and regular physical exercise as tolerated, 150 minutes per week (30 min each day, 5 days per week, or 50 min 3 days per week). Keep blood sugar logs with fasting goal of 80-130 mg/dl, post prandial less than 180.  For Hypoglycemia: BS <60 and Hyperglycemia BS >400; contact the clinic ASAP. Annual eye exams and foot exams are recommended.     Patient has been counseled on age-appropriate routine health concerns for screening and prevention. These are reviewed and up-to-date. Referrals have been placed accordingly. Immunizations are up-to-date or declined.    Subjective:   Chief Complaint  Patient presents with  . Wrist Pain    Pt. stated her left wrist pain hurts with movements. Pt. stated it has been going on for months but lately it hurts more.    VRI was used to communicate directly with patient for the entire encounter including providing detailed patient instructions.   Wrist Pain   The pain is present in the left wrist (and left thumb). This is a recurrent problem. The current episode started more than 1 month ago. There has been no history of extremity trauma. The problem occurs constantly. The problem has been gradually worsening. The quality of the pain is described as dull. The pain is moderate. Associated symptoms include joint swelling and a limited range of motion. Pertinent negatives include no fever. Exacerbated by:  pain is worse when she uses her left thumb. She has tried NSAIDS for the symptoms. The treatment provided no relief. Her past medical history is significant for diabetes. There is no history of gout, osteoarthritis or rheumatoid arthritis.    Review of Systems  Constitutional: Negative for fever, malaise/fatigue and weight loss.  HENT: Negative.  Negative for nosebleeds.   Eyes: Negative.  Negative for blurred vision, double vision and photophobia.  Respiratory: Negative.  Negative for cough and shortness of breath.   Cardiovascular: Negative.  Negative for chest pain, palpitations and leg swelling.  Gastrointestinal: Negative.  Negative for heartburn, nausea and vomiting.  Musculoskeletal: Positive for joint pain (and swelling of left wrist). Negative for gout and myalgias.  Neurological: Negative.  Negative for dizziness, focal weakness, seizures and headaches.  Psychiatric/Behavioral: Negative.  Negative for suicidal ideas.    Past Medical History:  Diagnosis Date  . Diabetes mellitus without complication [facility])     Past Surgical History:  Procedure Laterality Date  . ANKLE SURGERY  2004    Family History  Problem Relation Age of Onset  . Diabetes Mother     Social History Reviewed with no changes to be made today.   Outpatient Medications Prior to Visit  Medication Sig Dispense Refill  . atorvastatin (LIPITOR) 20 MG tablet Take 1 tablet (20 mg total) by mouth daily. 90 tablet 0  . Blood Glucose Monitoring Suppl (TRUE METRIX METER) w/Device KIT Use as instructed  1 kit 0  . glucose blood (TRUE METRIX BLOOD GLUCOSE TEST) test strip Use as instructed 100 each 12  . lisinopril (PRINIVIL,ZESTRIL) 5 MG tablet Take 1 tablet (5 mg total) by mouth daily. 90 tablet 0  . acetaminophen (TYLENOL) 500 MG tablet Take 1 tablet (500 mg total) by mouth every 6 (six) hours as needed. (Patient not taking: Reported on 07/06/2017) 30 tablet 0  . benzonatate (TESSALON) 100 MG capsule Take 1 capsule  (100 mg total) by mouth 2 (two) times daily as needed for cough. (Patient not taking: Reported on 08/30/2017) 20 capsule 0  . fluticasone (FLONASE) 50 MCG/ACT nasal spray Place 2 sprays into both nostrils daily. (Patient not taking: Reported on 07/06/2017) 48 g 0  . gabapentin (NEURONTIN) 100 MG capsule Take 1 capsule (100 mg total) by mouth 3 (three) times daily. (Patient not taking: Reported on 07/06/2017) 90 capsule 3  . ibuprofen (ADVIL,MOTRIN) 600 MG tablet Take 1 tablet (600 mg total) by mouth every 8 (eight) hours as needed. (Patient not taking: Reported on 08/30/2017) 30 tablet 0  . imiquimod (ALDARA) 5 % cream Apply topically 3 (three) times a week. (Patient not taking: Reported on 07/06/2017) 12 each 1  . TRUEPLUS LANCETS 28G MISC Use as instructed 100 each 3  . glimepiride (AMARYL) 2 MG tablet Take 1 tablet (2 mg total) by mouth daily with breakfast. 90 tablet 0  . metFORMIN (GLUCOPHAGE) 1000 MG tablet Take 1 tablet (1,000 mg total) by mouth 2 (two) times daily with a meal. 180 tablet 0   No facility-administered medications prior to visit.     No Known Allergies     Objective:    BP 131/84 (BP Location: Left Arm, Patient Position: Sitting, Cuff Size: Large)   Pulse 62   Temp 98.3 F (36.8 C) (Oral)   Ht '5\' 9"'$  (1.753 m)   Wt 238 lb (108 kg)   SpO2 100%   BMI 35.15 kg/m  Wt Readings from Last 3 Encounters:  08/30/17 238 lb (108 kg)  07/06/17 238 lb 3.2 oz (108 kg)  02/10/17 239 lb 9.6 oz (108.7 kg)    Physical Exam  Constitutional: She is oriented to person, place, and time. She appears well-developed and well-nourished. She is cooperative.  HENT:  Head: Normocephalic and atraumatic.  Eyes: EOM are normal.  Neck: Normal range of motion.  Cardiovascular: Normal rate, regular rhythm and normal heart sounds. Exam reveals no gallop and no friction rub.  No murmur heard. Pulmonary/Chest: Effort normal and breath sounds normal. No tachypnea. No respiratory distress. She has no  decreased breath sounds. She has no wheezes. She has no rhonchi. She has no rales. She exhibits no tenderness.  Musculoskeletal: She exhibits edema, tenderness and deformity.       Left wrist: She exhibits decreased range of motion, tenderness, swelling and deformity. She exhibits no bony tenderness and no laceration.  Pain is elicited with bilateral grip strength test as well bilateral push pull command.   Neurological: She is alert and oriented to person, place, and time. Coordination normal.  Skin: Skin is warm and dry.  Psychiatric: She has a normal mood and affect. Her behavior is normal. Judgment and thought content normal.  Nursing note and vitals reviewed.      Patient has been counseled extensively about nutrition and exercise as well as the importance of adherence with medications and regular follow-up. The patient was given clear instructions to go to ER or return to medical center if symptoms  don't improve, worsen or new problems develop. The patient verbalized understanding.   Follow-up: Return in about 3 weeks (around 09/20/2017) for left hand pain.   Gildardo Pounds, FNP-BC [facility] and [place], [place]   08/30/2017, 2:20 PM

## 2017-08-31 ENCOUNTER — Ambulatory Visit (HOSPITAL_COMMUNITY)
Admission: RE | Admit: 2017-08-31 | Discharge: 2017-08-31 | Disposition: A | Discharge status: Home / Self Care | Payer: Self-pay | Source: Ambulatory Visit | Attending: Nurse Practitioner | Admitting: Nurse Practitioner

## 2017-08-31 DIAGNOSIS — M79642 Pain in left hand: Secondary | ICD-10-CM | POA: Insufficient documentation

## 2017-08-31 LAB — URIC ACID: Uric Acid: 4.2 mg/dL (ref 2.5–7.1)

## 2017-09-07 ENCOUNTER — Telehealth: Payer: Self-pay

## 2017-09-07 NOTE — Telephone Encounter (Signed)
-----   Message from Claiborne RiggZelda W Fleming, NP sent at 09/05/2017 12:56 PM EDT ----- Left wrist xray is normal. Will follow up at next office visit.

## 2017-09-07 NOTE — Telephone Encounter (Signed)
CMA attempt to call patient to inform on X-ray results. No answer and left a VM for patient to call back.  If patient call back, please inform:  Left wrist xray is normal. Will follow up at next office visit.

## 2017-09-24 ENCOUNTER — Other Ambulatory Visit: Payer: Self-pay | Admitting: Nurse Practitioner

## 2017-09-24 MED FILL — LISINOPRIL 5 MG TAB: 5 | 30 days supply | Qty: 30 | Fill #2

## 2017-09-24 MED FILL — metFORMIN HCL 1000 MG TABS: 1000 | 30 days supply | Qty: 60 | Fill #0

## 2017-09-24 MED FILL — GLIMEPIRIDE 2 MG TABS: 2 | 30 days supply | Qty: 30 | Fill #2

## 2017-09-24 MED FILL — ?ATORVASTATIN 20 MG TABLET: 20 | 30 days supply | Qty: 30 | Fill #0

## 2017-10-05 ENCOUNTER — Ambulatory Visit: Payer: Self-pay | Admitting: Nurse Practitioner

## 2017-10-29 ENCOUNTER — Other Ambulatory Visit: Payer: Self-pay | Admitting: Nurse Practitioner

## 2017-10-29 DIAGNOSIS — I10 Essential (primary) hypertension: Secondary | ICD-10-CM

## 2017-10-29 MED FILL — ?METFORMIN HCL 1000MG TABS: 1000 | 30 days supply | Qty: 60 | Fill #1

## 2017-10-29 MED FILL — LISINOPRIL 5 MG TAB: 5 | 30 days supply | Qty: 30 | Fill #0

## 2017-10-29 MED FILL — GLIMEPIRIDE 2 MG TABS: 2 | 30 days supply | Qty: 30 | Fill #0

## 2017-10-29 MED FILL — ?ATORVASTATIN 20 MG TABLET: 20 | 30 days supply | Qty: 30 | Fill #1

## 2017-11-05 ENCOUNTER — Ambulatory Visit: Payer: Self-pay | Attending: Family Medicine

## 2017-11-30 MED FILL — ?METFORMIN HCL 1000MG TABS: 1000 | 30 days supply | Qty: 60 | Fill #2

## 2017-11-30 MED FILL — ?ATORVASTATIN 20 MG TABLET: 20 | 30 days supply | Qty: 30 | Fill #2

## 2017-11-30 MED FILL — LISINOPRIL 5 MG TAB: 5 | 30 days supply | Qty: 30 | Fill #1

## 2017-11-30 MED FILL — GLIMEPIRIDE 2 MG TABS: 2 | 30 days supply | Qty: 30 | Fill #1

## 2017-12-08 ENCOUNTER — Ambulatory Visit: Payer: Self-pay | Admitting: Nurse Practitioner

## 2017-12-13 ENCOUNTER — Ambulatory Visit: Payer: Self-pay | Admitting: Nurse Practitioner

## 2017-12-24 ENCOUNTER — Ambulatory Visit: Payer: Self-pay | Admitting: Nurse Practitioner

## 2018-01-12 ENCOUNTER — Other Ambulatory Visit: Payer: Self-pay | Admitting: Nurse Practitioner

## 2018-01-12 MED FILL — GLIMEPIRIDE 2 MG TABS: 2 | 30 days supply | Qty: 30 | Fill #2

## 2018-01-12 MED FILL — LISINOPRIL 5 MG TABLET: 5 | 30 days supply | Qty: 30 | Fill #2

## 2018-01-12 MED FILL — ?METFORMIN HCL 1000MG TABS: 1000 | 30 days supply | Qty: 60 | Fill #2

## 2018-02-14 ENCOUNTER — Other Ambulatory Visit: Payer: Self-pay | Admitting: Nurse Practitioner

## 2018-02-14 DIAGNOSIS — E119 Type 2 diabetes mellitus without complications: Secondary | ICD-10-CM

## 2018-02-14 DIAGNOSIS — I10 Essential (primary) hypertension: Secondary | ICD-10-CM

## 2018-02-14 MED FILL — ?METFORMIN HCL 1,000 MG TAB: 1000 | 30 days supply | Qty: 60 | Fill #0

## 2018-02-14 MED FILL — ?ATORVASTATIN 20 MG TABLET: 20 | 30 days supply | Qty: 30 | Fill #0

## 2018-02-14 MED FILL — GLIMEPIRIDE 2 MG TABS: 2 | 30 days supply | Qty: 30 | Fill #0

## 2018-02-14 MED FILL — LISINOPRIL 5 MG TAB: 5 | 30 days supply | Qty: 30 | Fill #0

## 2018-03-28 ENCOUNTER — Other Ambulatory Visit: Payer: Self-pay | Admitting: Nurse Practitioner

## 2018-03-28 DIAGNOSIS — I10 Essential (primary) hypertension: Secondary | ICD-10-CM

## 2018-03-28 DIAGNOSIS — E119 Type 2 diabetes mellitus without complications: Secondary | ICD-10-CM

## 2018-03-28 MED ORDER — METFORMIN HCL 1000 MG PO TABS: 1000.0000 mg | ORAL_TABLET | Freq: Two times a day (BID) | ORAL | 0 refills | Status: DC

## 2018-03-28 MED ORDER — ATORVASTATIN CALCIUM 20 MG PO TABS: 20.0000 mg | ORAL_TABLET | Freq: Every day | ORAL | 0 refills | Status: DC

## 2018-03-28 MED ORDER — GLIMEPIRIDE 2 MG PO TABS: 2.0000 mg | ORAL_TABLET | Freq: Every day | ORAL | 0 refills | Status: DC

## 2018-03-28 MED ORDER — LISINOPRIL 5 MG PO TABS: 5.0000 mg | ORAL_TABLET | Freq: Every day | ORAL | 0 refills | Status: DC

## 2018-03-28 MED FILL — GLIMEPIRIDE 2 MG TABS: 2 | 30 days supply | Qty: 30 | Fill #0

## 2018-03-28 MED FILL — LISINOPRIL 5 MG TAB: 5 | 30 days supply | Qty: 30 | Fill #0

## 2018-03-28 MED FILL — ?ATORVASTATIN 20 MG TABLET: 20 | 30 days supply | Qty: 30 | Fill #0

## 2018-03-28 MED FILL — ?METFORMIN HCL 1000MG TABS: 1000 | 30 days supply | Qty: 60 | Fill #0

## 2018-05-10 ENCOUNTER — Other Ambulatory Visit: Payer: Self-pay | Admitting: Nurse Practitioner

## 2018-05-10 DIAGNOSIS — E119 Type 2 diabetes mellitus without complications: Secondary | ICD-10-CM

## 2018-05-10 DIAGNOSIS — I10 Essential (primary) hypertension: Secondary | ICD-10-CM

## 2018-05-18 ENCOUNTER — Other Ambulatory Visit: Payer: Self-pay

## 2018-05-18 ENCOUNTER — Ambulatory Visit: Payer: Self-pay | Attending: Nurse Practitioner | Admitting: Physician Assistant

## 2018-05-18 ENCOUNTER — Ambulatory Visit: Payer: Self-pay | Attending: Family Medicine

## 2018-05-18 VITALS — BP 127/85 | HR 57 | Temp 98.1°F | Resp 16 | Wt 253.8 lb

## 2018-05-18 DIAGNOSIS — I10 Essential (primary) hypertension: Secondary | ICD-10-CM

## 2018-05-18 DIAGNOSIS — E119 Type 2 diabetes mellitus without complications: Secondary | ICD-10-CM

## 2018-05-18 DIAGNOSIS — R52 Pain, unspecified: Secondary | ICD-10-CM

## 2018-05-18 DIAGNOSIS — E1169 Type 2 diabetes mellitus with other specified complication: Secondary | ICD-10-CM

## 2018-05-18 DIAGNOSIS — E782 Mixed hyperlipidemia: Secondary | ICD-10-CM

## 2018-05-18 LAB — POCT GLYCOSYLATED HEMOGLOBIN (HGB A1C): HbA1c, POC (controlled diabetic range): 7.6 % — AB (ref 0.0–7.0)

## 2018-05-18 LAB — GLUCOSE, POCT (MANUAL RESULT ENTRY): POC Glucose: 113 mg/dl — AB (ref 70–99)

## 2018-05-18 MED ORDER — LISINOPRIL 5 MG PO TABS: 5.0000 mg | ORAL_TABLET | Freq: Every day | ORAL | 5 refills | Status: DC

## 2018-05-18 MED ORDER — GLIMEPIRIDE 4 MG PO TABS: 4.0000 mg | ORAL_TABLET | Freq: Every day | ORAL | 3 refills | Status: DC

## 2018-05-18 MED ORDER — ATORVASTATIN CALCIUM 20 MG PO TABS: 20.0000 mg | ORAL_TABLET | Freq: Every day | ORAL | 3 refills | Status: DC

## 2018-05-18 MED ORDER — METFORMIN HCL 1000 MG PO TABS: 1000.0000 mg | ORAL_TABLET | Freq: Two times a day (BID) | ORAL | 5 refills | Status: DC

## 2018-05-18 MED ORDER — NAPROXEN 500 MG PO TABS: 500.0000 mg | ORAL_TABLET | Freq: Two times a day (BID) | ORAL | 1 refills | Status: DC

## 2018-05-18 MED FILL — NAPROXEN 500 MG TABLET: 500 | 30 days supply | Qty: 60 | Fill #0

## 2018-05-18 MED FILL — LISINOPRIL 5 MG TAB: 5 | 30 days supply | Qty: 30 | Fill #0

## 2018-05-18 MED FILL — ATORVASTATIN 20 MG TABLET: 20 | 30 days supply | Qty: 30 | Fill #0

## 2018-05-18 MED FILL — ?METFORMIN HCL 1000 MG TAB: 1000 | 30 days supply | Qty: 60 | Fill #0

## 2018-05-18 MED FILL — ?GLIMEPIRIDE 4 MG TABLET: 4 | 30 days supply | Qty: 30 | Fill #0

## 2018-05-18 NOTE — Patient Instructions (Signed)
Drink 80-100 ounces water daily 

## 2018-05-18 NOTE — Progress Notes (Signed)
Pt states she is having pain through out her joints

## 2018-05-18 NOTE — Progress Notes (Signed)
Patient ID: Christina Fields, female   DOB: 10-13-1960, 58 y.o.   MRN: [id number]   Christina Fields, is a 58 y.o. female  IEP:[id number]  YSA:[id number]  DOB - [birth year]  Subjective:  Chief Complaint and HPI: Christina Fields is a 58 y.o. female here today for med check and body aches.  All over body aches for over 1 month.  Ibuprofen gives relief for about an hour.  No tick bites.  No recent travel.  No fever.  No joint swelling.    Not checking blood sugars.  Not following diabetic diet.  Out of all meds about 7-10 days.  No symptoms hyper/hypoglycemia.    Compliant with BP and cholesterol meds but out for about a week.  Patient is non-fasting.    ROS:   Constitutional:  No f/c, No night sweats, No unexplained weight loss. EENT:  No vision changes, No blurry vision, No hearing changes. No mouth, throat, or ear problems.  Respiratory: No cough, No SOB Cardiac: No CP, no palpitations GI:  No abd pain, No N/V/D. GU: No Urinary s/sx Musculoskeletal: +bodyaches.   Neuro: No headache, no dizziness, no motor weakness.  Skin: No rash Endocrine:  No polydipsia. No polyuria.  Psych: Denies SI/HI  No problems updated.  ALLERGIES: No Known Allergies  PAST MEDICAL HISTORY: Past Medical History:  Diagnosis Date  . Diabetes mellitus without complication ([place])     MEDICATIONS AT HOME: Prior to Admission medications   Medication Sig Start Date End Date Taking? Authorizing Provider  acetaminophen (TYLENOL) 500 MG tablet Take 1 tablet (500 mg total) by mouth every 6 (six) hours as needed. Patient not taking: Reported on 07/06/2017 02/10/17   Gildardo Pounds, NP  atorvastatin (LIPITOR) 20 MG tablet Take 1 tablet (20 mg total) by mouth daily. 05/18/18   Argentina Donovan, PA-C  Blood Glucose Monitoring Suppl (TRUE METRIX METER) w/Device KIT Use as instructed 07/06/17   Gildardo Pounds, NP  fluticasone [facility]) 50 MCG/ACT nasal spray Place 2 sprays into both nostrils daily. Patient not taking:  Reported on 07/06/2017 03/18/17   Gildardo Pounds, NP  glimepiride (AMARYL) 4 MG tablet Take 1 tablet (4 mg total) by mouth daily before breakfast. 05/18/18   Argentina Donovan, PA-C  glucose blood (TRUE METRIX BLOOD GLUCOSE TEST) test strip Use as instructed 07/06/17   Gildardo Pounds, NP  ibuprofen (ADVIL,MOTRIN) 600 MG tablet Take 1 tablet (600 mg total) by mouth every 8 (eight) hours as needed. Patient not taking: Reported on 08/30/2017 02/10/17   Gildardo Pounds, NP  imiquimod [place]) 5 % cream Apply topically 3 (three) times a week. Patient not taking: Reported on 07/06/2017 02/10/17   Gildardo Pounds, NP  lisinopril (PRINIVIL,ZESTRIL) 5 MG tablet Take 1 tablet (5 mg total) by mouth daily. 05/18/18   Argentina Donovan, PA-C  metFORMIN (GLUCOPHAGE) 1000 MG tablet Take 1 tablet (1,000 mg total) by mouth 2 (two) times daily with a meal. 05/18/18   Maley Venezia, Dionne Bucy, PA-C  naproxen (NAPROSYN) 500 MG tablet Take 1 tablet (500 mg total) by mouth 2 (two) times daily with a meal. Prn pain 05/18/18   Argentina Donovan, PA-C  TRUEPLUS LANCETS 28G MISC Use as instructed 07/06/17   Gildardo Pounds, NP     Objective:  EXAM:   Vitals:   05/18/18 1440  BP: 127/85  Pulse: (!) 57  Resp: 16  Temp: 98.1 F (36.7 C)  TempSrc: Oral  SpO2: 100%  Weight: 253 lb  12.8 oz (115.1 kg)    General appearance : A&OX3. NAD. Non-toxic-appearing HEENT: Atraumatic and Normocephalic.  PERRLA. EOM intact.   Neck: supple, no JVD. No cervical lymphadenopathy. No thyromegaly Chest/Lungs:  Breathing-non-labored, Good air entry bilaterally, breath sounds normal without rales, rhonchi, or wheezing  CVS: S1 S2 regular, no murmurs, gallops, rubs  Extremities: Bilateral Lower Ext shows no edema, both legs are warm to touch with = pulse throughout Neurology:  CN II-XII grossly intact, Non focal.   Psych:  TP linear. J/I WNL. Normal speech. Appropriate eye contact and affect.  Skin:  No Rash  Data Review Lab Results   Component Value Date   HGBA1C 7.6 (A) 05/18/2018   HGBA1C 6.9 07/06/2017   HGBA1C 10.2 01/27/2017     Assessment & Plan   1. Body aches - TSH - Vitamin D, 25-hydroxy - naproxen (NAPROSYN) 500 MG tablet; Take 1 tablet (500 mg total) by mouth 2 (two) times daily with a meal. Prn pain  Dispense: 60 tablet; Refill: 1  2. Type 2 diabetes mellitus without complication, without long-term current use of insulin (HCC) Uncontrolled-increase amaryl dose.  Diabetic diet encouraged.   - Glucose (CBG) - HgB A1c - TSH - Comprehensive metabolic panel - glimepiride (AMARYL) 4 MG tablet; Take 1 tablet (4 mg total) by mouth daily before breakfast.  Dispense: 30 tablet; Refill: 3 - metFORMIN (GLUCOPHAGE) 1000 MG tablet; Take 1 tablet (1,000 mg total) by mouth 2 (two) times daily with a meal.  Dispense: 60 tablet; Refill: 5  3. Essential hypertension Controlled-continue current regimen - TSH - lisinopril (PRINIVIL,ZESTRIL) 5 MG tablet; Take 1 tablet (5 mg total) by mouth daily.  Dispense: 30 tablet; Refill: 5  4. Combined hyperlipidemia associated with type 2 diabetes mellitus (HCC) - atorvastatin (LIPITOR) 20 MG tablet; Take 1 tablet (20 mg total) by mouth daily.  Dispense: 30 tablet; Refill: 3  Patient have been counseled extensively about nutrition and exercise  Return in about 3 months (around 08/18/2018) for Zelda for DM and htn and fasting labs.  The patient was given clear instructions to go to ER or return to medical center if symptoms don't improve, worsen or new problems develop. The patient verbalized understanding. The patient was told to call to get lab results if they haven't heard anything in the next week.     Freeman Caldron, PA-C [facility] and [place], [place]   05/18/2018, 3:08 PM

## 2018-05-19 ENCOUNTER — Other Ambulatory Visit: Payer: Self-pay | Admitting: Physician Assistant

## 2018-05-19 LAB — COMPREHENSIVE METABOLIC PANEL
ALT: 12 IU/L (ref 0–32)
AST: 14 IU/L (ref 0–40)
Albumin/Globulin Ratio: 1.3 (ref 1.2–2.2)
Albumin: 4 g/dL (ref 3.8–4.9)
Alkaline Phosphatase: 72 IU/L (ref 39–117)
BUN/Creatinine Ratio: 23 (ref 9–23)
BUN: 18 mg/dL (ref 6–24)
Bilirubin Total: <0.2 mg/dL (ref 0.0–1.2)
CO2: 23 mmol/L (ref 20–29)
Calcium: 9.6 mg/dL (ref 8.7–10.2)
Chloride: 103 mmol/L (ref 96–106)
Creatinine, Ser: 0.79 mg/dL (ref 0.57–1.00)
GFR calc Af Amer: 95 mL/min/{1.73_m2} (ref >59)
GFR calc non Af Amer: 83 mL/min/{1.73_m2} (ref >59)
Globulin, Total: 3.2 g/dL (ref 1.5–4.5)
Glucose: 94 mg/dL (ref 65–99)
Potassium: 4.5 mmol/L (ref 3.5–5.2)
Sodium: 142 mmol/L (ref 134–144)
Total Protein: 7.2 g/dL (ref 6.0–8.5)

## 2018-05-19 LAB — TSH: TSH: 1.51 u[IU]/mL (ref 0.450–4.500)

## 2018-05-19 LAB — VITAMIN D 25 HYDROXY (VIT D DEFICIENCY, FRACTURES): VIT D 25 HYDROXY: 21.1 ng/mL — AB (ref 30.0–100.0)

## 2018-05-19 MED ORDER — VITAMIN D (ERGOCALCIFEROL) 1.25 MG (50000 UNIT) PO CAPS: 50000.0000 [IU] | ORAL_CAPSULE | ORAL | 0 refills | Status: DC

## 2018-05-20 MED FILL — VIT D2 1.25 MG (50,000 UNIT: 1.25 MG | 84 days supply | Qty: 12 | Fill #0

## 2018-05-23 ENCOUNTER — Telehealth: Payer: Self-pay | Admitting: Nurse Practitioner

## 2018-05-23 NOTE — Telephone Encounter (Signed)
Called pt in regards to her Financial assistance application, she is aware that to finalize her application her complete bank statements are needed from the past 3 months. She only provided the transaction history. She had no questions or concerns and agreed to the same deadline 05/25/2018.

## 2018-05-24 ENCOUNTER — Telehealth: Payer: Self-pay

## 2018-05-24 NOTE — Telephone Encounter (Signed)
[employer] Amy  Id# [id number]  contacted pt to go over lab results pt is aware and doesn't have any questions or concerns   

## 2018-05-25 ENCOUNTER — Ambulatory Visit: Payer: Self-pay

## 2018-06-17 MED FILL — ?METFORMIN HCL 1000 MG TAB: 1000 | 30 days supply | Qty: 60 | Fill #1

## 2018-06-17 MED FILL — LISINOPRIL 5 MG TAB: 5 | 30 days supply | Qty: 30 | Fill #1

## 2018-06-17 MED FILL — ?ATORVASTATIN 20 MG TABLET: 20 | 30 days supply | Qty: 30 | Fill #1

## 2018-06-17 MED FILL — GLIMEPIRIDE 4 MG TABS: 4 | 30 days supply | Qty: 30 | Fill #1

## 2018-07-25 MED FILL — GLIMEPIRIDE 4 MG TABS: 4 | 30 days supply | Qty: 30 | Fill #2

## 2018-07-25 MED FILL — ?METFORMIN HCL 1000 MG TAB: 1000 | 30 days supply | Qty: 60 | Fill #2

## 2018-07-25 MED FILL — LISINOPRIL 5 MG TAB: 5 | 30 days supply | Qty: 30 | Fill #2

## 2018-07-25 MED FILL — ?ATORVASTATIN 20 MG TABLET: 20 | 30 days supply | Qty: 30 | Fill #2

## 2018-08-22 ENCOUNTER — Ambulatory Visit: Payer: BLUE CROSS/BLUE SHIELD | Attending: Nurse Practitioner | Admitting: Nurse Practitioner

## 2018-08-22 ENCOUNTER — Other Ambulatory Visit: Payer: Self-pay

## 2018-08-22 ENCOUNTER — Encounter: Payer: Self-pay | Admitting: Nurse Practitioner

## 2018-08-22 VITALS — BP 112/70 | HR 72 | Temp 98.5°F | Ht 69.0 in | Wt 251.8 lb

## 2018-08-22 DIAGNOSIS — Z1211 Encounter for screening for malignant neoplasm of colon: Secondary | ICD-10-CM

## 2018-08-22 DIAGNOSIS — Z114 Encounter for screening for human immunodeficiency virus [HIV]: Secondary | ICD-10-CM

## 2018-08-22 DIAGNOSIS — E782 Mixed hyperlipidemia: Secondary | ICD-10-CM

## 2018-08-22 DIAGNOSIS — E1165 Type 2 diabetes mellitus with hyperglycemia: Secondary | ICD-10-CM | POA: Diagnosis not present

## 2018-08-22 DIAGNOSIS — M791 Myalgia, unspecified site: Secondary | ICD-10-CM

## 2018-08-22 DIAGNOSIS — L819 Disorder of pigmentation, unspecified: Secondary | ICD-10-CM

## 2018-08-22 DIAGNOSIS — Z1159 Encounter for screening for other viral diseases: Secondary | ICD-10-CM

## 2018-08-22 DIAGNOSIS — E559 Vitamin D deficiency, unspecified: Secondary | ICD-10-CM

## 2018-08-22 LAB — POCT GLYCOSYLATED HEMOGLOBIN (HGB A1C): Hemoglobin A1C: 7.8 % — AB (ref 4.0–5.6)

## 2018-08-22 LAB — GLUCOSE, POCT (MANUAL RESULT ENTRY): POC Glucose: 243 mg/dl — AB (ref 70–99)

## 2018-08-22 MED ORDER — CYCLOBENZAPRINE HCL 5 MG PO TABS: 5.0000 mg | ORAL_TABLET | Freq: Three times a day (TID) | ORAL | 1 refills | Status: DC | PRN

## 2018-08-22 MED ORDER — IBUPROFEN 600 MG PO TABS: 600.0000 mg | ORAL_TABLET | Freq: Three times a day (TID) | ORAL | 0 refills | Status: DC | PRN

## 2018-08-22 MED ORDER — GLIMEPIRIDE 4 MG PO TABS: 8.0000 mg | ORAL_TABLET | Freq: Every day | ORAL | 3 refills | Status: DC

## 2018-08-22 MED FILL — IBUPROFEN 600 MG TABLET: 600 | 20 days supply | Qty: 60 | Fill #0

## 2018-08-22 NOTE — Progress Notes (Signed)
Assessment & Plan:  Palm was seen today for follow-up and pain.  Diagnoses and all orders for this visit:  Type 2 diabetes mellitus with hyperglycemia, without long-term current use of insulin (HCC) -     Glucose (CBG) -     HgB A1c -     CBC -     Basic metabolic panel -     Ambulatory referral to Ophthalmology -     glimepiride (AMARYL) 4 MG tablet; Take 2 tablets (8 mg total) by mouth daily before breakfast. Diabetes is poorly controlled. Advised patient to keep a fasting blood sugar log fast, 2 hours post lunch and bedtime which will be reviewed at the next office visit. Continue blood sugar control as discussed in office today, low carbohydrate diet, and regular physical exercise as tolerated, 150 minutes per week (30 min each day, 5 days per week, or 50 min 3 days per week). Keep blood sugar logs with fasting goal of 90-130 mg/dl, post prandial (after you eat) less than 180.  For Hypoglycemia: BS <60 and Hyperglycemia BS >400; contact the clinic ASAP. Annual eye exams and foot exams are recommended.  Mixed hyperlipidemia -     Lipid panel INSTRUCTIONS: Work on a low fat, heart healthy diet and participate in regular aerobic exercise program by working out at least 150 minutes per week; 5 days a week-30 minutes per day. Avoid red meat, fried foods. junk foods, sodas, sugary drinks, unhealthy snacking, alcohol and smoking.  Drink at least 48oz of water per day and monitor your carbohydrate intake daily.   Encounter for hepatitis C screening test for low risk patient -     Hepatitis C Antibody  Colon cancer screening -     Fecal occult blood, imunochemical([facility])  Encounter for screening for HIV -     HIV antibody (with reflex)  Vitamin D deficiency disease -     VITAMIN D 25 Hydroxy (Vit-D Deficiency, Fractures)  Hyperpigmented skin lesion -     Ambulatory referral to Dermatology  Myalgia -     cyclobenzaprine (FLEXERIL) 5 MG tablet; Take 1 tablet (5 mg  total) by mouth 3 (three) times daily as needed for muscle spasms. -     ibuprofen (ADVIL) 600 MG tablet; Take 1 tablet (600 mg total) by mouth every 8 (eight) hours as needed.    Patient has been counseled on age-appropriate routine health concerns for screening and prevention. These are reviewed and up-to-date. Referrals have been placed accordingly. Immunizations are up-to-date or declined.    Subjective:   Chief Complaint  Patient presents with  . Follow-up    Pt. is here to follow up on diabetes.   . Pain    Pt. stated she is having pain on her left arm.   HPI Christina Fields 58 y.o. female presents to office today for follow up.  has a past medical history of Diabetes mellitus without complication ([place]) and Hyperlipidemia.    DM TYPE 2 Metformin 1000 mg BID, glimepiride 4 mg daily. On ACE and statin. Blood pressure well controlled. She endorses medication compliance. Weight is stable. She denies any hypo or hyperglycemic symptoms. She does not routinely check her blood glucose levels at home. Diet is not consistently adherent with diabetes. She is obese. Not exercising.  Lab Results  Component Value Date   HGBA1C 7.8 (A) 08/22/2018   Lab Results  Component Value Date   HGBA1C 7.6 (A) 05/18/2018   BP Readings from Last 3 Encounters:  08/22/18 112/70  05/18/18 127/85  08/30/17 131/84    Mixed Hyperlipidemia LDL not at goal. She endorses non adherence with taking atorvastatin 20 mg daily. Denies any statin intolerance. Fasting lipid panel pending.  Lab Results  Component Value Date   LDLCALC 128 (H) 01/27/2017   Review of Systems  Constitutional: Negative for fever, malaise/fatigue and weight loss.  HENT: Negative.  Negative for nosebleeds.   Eyes: Negative.  Negative for blurred vision, double vision and photophobia.  Respiratory: Negative.  Negative for cough and shortness of breath.   Cardiovascular: Negative.  Negative for chest pain, palpitations and leg  swelling.  Gastrointestinal: Negative.  Negative for heartburn, nausea and vomiting.  Musculoskeletal: Positive for myalgias (left arm).  Neurological: Negative.  Negative for dizziness, focal weakness, seizures and headaches.  Psychiatric/Behavioral: Negative.  Negative for suicidal ideas.    Past Medical History:  Diagnosis Date  . Diabetes mellitus without complication ([place])   . Hyperlipidemia     Past Surgical History:  Procedure Laterality Date  . ANKLE SURGERY  2004    Family History  Problem Relation Age of Onset  . Diabetes Mother     Social History Reviewed with no changes to be made today.   Outpatient Medications Prior to Visit  Medication Sig Dispense Refill  . atorvastatin (LIPITOR) 20 MG tablet Take 1 tablet (20 mg total) by mouth daily. 30 tablet 3  . metFORMIN (GLUCOPHAGE) 1000 MG tablet Take 1 tablet (1,000 mg total) by mouth 2 (two) times daily with a meal. 60 tablet 5  . glimepiride (AMARYL) 4 MG tablet Take 1 tablet (4 mg total) by mouth daily before breakfast. 30 tablet 3  . acetaminophen (TYLENOL) 500 MG tablet Take 1 tablet (500 mg total) by mouth every 6 (six) hours as needed. (Patient not taking: Reported on 07/06/2017) 30 tablet 0  . Blood Glucose Monitoring Suppl (TRUE METRIX METER) w/Device KIT Use as instructed (Patient not taking: Reported on 08/22/2018) 1 kit 0  . glucose blood (TRUE METRIX BLOOD GLUCOSE TEST) test strip Use as instructed (Patient not taking: Reported on 08/22/2018) 100 each 12  . lisinopril (PRINIVIL,ZESTRIL) 5 MG tablet Take 1 tablet (5 mg total) by mouth daily. 30 tablet 5  . naproxen (NAPROSYN) 500 MG tablet Take 1 tablet (500 mg total) by mouth 2 (two) times daily with a meal. Prn pain (Patient not taking: Reported on 08/22/2018) 60 tablet 1  . TRUEPLUS LANCETS 28G MISC Use as instructed (Patient not taking: Reported on 08/22/2018) 100 each 3  . Vitamin D, Ergocalciferol, (DRISDOL) 1.25 MG (50000 UT) CAPS capsule Take 1 capsule  (50,000 Units total) by mouth every 7 (seven) days. 12 capsule 0  . fluticasone (FLONASE) 50 MCG/ACT nasal spray Place 2 sprays into both nostrils daily. (Patient not taking: Reported on 07/06/2017) 48 g 0  . ibuprofen (ADVIL,MOTRIN) 600 MG tablet Take 1 tablet (600 mg total) by mouth every 8 (eight) hours as needed. (Patient not taking: Reported on 08/30/2017) 30 tablet 0  . imiquimod (ALDARA) 5 % cream Apply topically 3 (three) times a week. (Patient not taking: Reported on 07/06/2017) 12 each 1   No facility-administered medications prior to visit.     No Known Allergies     Objective:    BP 112/70 (BP Location: Left Arm, Patient Position: Sitting, Cuff Size: Large)   Pulse 72   Temp 98.5 F (36.9 C) (Oral)   Ht '5\' 9"'$  (1.753 m)   Wt 251 lb 12.8 oz (114.2  kg)   SpO2 98%   BMI 37.18 kg/m  Wt Readings from Last 3 Encounters:  08/22/18 251 lb 12.8 oz (114.2 kg)  05/18/18 253 lb 12.8 oz (115.1 kg)  08/30/17 238 lb (108 kg)    Physical Exam Vitals signs and nursing note reviewed.  Constitutional:      Appearance: She is well-developed.  HENT:     Head: Normocephalic and atraumatic.  Neck:     Musculoskeletal: Normal range of motion.  Cardiovascular:     Rate and Rhythm: Normal rate and regular rhythm.     Pulses:          Dorsalis pedis pulses are 2+ on the right side and 2+ on the left side.       Posterior tibial pulses are 2+ on the right side and 2+ on the left side.     Heart sounds: Normal heart sounds. No murmur. No friction rub. No gallop.   Pulmonary:     Effort: Pulmonary effort is normal. No tachypnea or respiratory distress.     Breath sounds: Normal breath sounds. No decreased breath sounds, wheezing, rhonchi or rales.  Chest:     Chest wall: No tenderness.  Abdominal:     General: Bowel sounds are normal.     Palpations: Abdomen is soft.  Musculoskeletal: Normal range of motion.     Left upper arm: She exhibits tenderness. She exhibits no bony tenderness and  no swelling.       Arms:       Feet:  Feet:     Right foot:     Protective Sensation: 10 sites tested. 10 sites sensed.     Skin integrity: No skin breakdown, erythema, callus or dry skin.     Left foot:     Protective Sensation: 10 sites tested. 10 sites sensed.     Skin integrity: No skin breakdown, erythema, callus or dry skin.  Skin:    General: Skin is warm and dry.  Neurological:     Mental Status: She is alert and oriented to person, place, and time.     Coordination: Coordination normal.  Psychiatric:        Behavior: Behavior normal. Behavior is cooperative.        Thought Content: Thought content normal.        Judgment: Judgment normal.          Patient has been counseled extensively about nutrition and exercise as well as the importance of adherence with medications and regular follow-up. The patient was given clear instructions to go to ER or return to medical center if symptoms don't improve, worsen or new problems develop. The patient verbalized understanding.   Follow-up: Return in about 3 months (around 11/22/2018) for 8 weeks for PAP and 3 months for DM, HPL.   Gildardo Pounds, FNP-BC [facility] and [place], [place]   08/22/2018, 9:29 AM

## 2018-08-23 LAB — LIPID PANEL
Chol/HDL Ratio: 4.4 ratio (ref 0.0–4.4)
Cholesterol, Total: 210 mg/dL — ABNORMAL HIGH (ref 100–199)
HDL: 48 mg/dL (ref >39)
LDL Calculated: 102 mg/dL — ABNORMAL HIGH (ref 0–99)
Triglycerides: 302 mg/dL — ABNORMAL HIGH (ref 0–149)
VLDL Cholesterol Cal: 60 mg/dL — ABNORMAL HIGH (ref 5–40)

## 2018-08-23 LAB — BASIC METABOLIC PANEL
BUN/Creatinine Ratio: 23 (ref 9–23)
BUN: 16 mg/dL (ref 6–24)
CO2: 16 mmol/L — ABNORMAL LOW (ref 20–29)
Calcium: 9.2 mg/dL (ref 8.7–10.2)
Chloride: 103 mmol/L (ref 96–106)
Creatinine, Ser: 0.7 mg/dL (ref 0.57–1.00)
GFR calc Af Amer: 110 mL/min/{1.73_m2} (ref >59)
GFR calc non Af Amer: 96 mL/min/{1.73_m2} (ref >59)
Glucose: 246 mg/dL — ABNORMAL HIGH (ref 65–99)
Potassium: 4.3 mmol/L (ref 3.5–5.2)
Sodium: 139 mmol/L (ref 134–144)

## 2018-08-23 LAB — CBC
Hematocrit: 37.7 % (ref 34.0–46.6)
Hemoglobin: 12.7 g/dL (ref 11.1–15.9)
MCH: 29.3 pg (ref 26.6–33.0)
MCHC: 33.7 g/dL (ref 31.5–35.7)
MCV: 87 fL (ref 79–97)
Platelets: 222 10*3/uL (ref 150–450)
RBC: 4.33 x10E6/uL (ref 3.77–5.28)
RDW: 13.9 % (ref 11.7–15.4)
WBC: 6.1 10*3/uL (ref 3.4–10.8)

## 2018-08-23 LAB — VITAMIN D 25 HYDROXY (VIT D DEFICIENCY, FRACTURES): Vit D, 25-Hydroxy: 21 ng/mL — ABNORMAL LOW (ref 30.0–100.0)

## 2018-08-23 LAB — HIV ANTIBODY (ROUTINE TESTING W REFLEX): HIV Screen 4th Generation wRfx: NONREACTIVE

## 2018-08-23 LAB — HEPATITIS C ANTIBODY: Hep C Virus Ab: <0.1 s/co ratio (ref 0.0–0.9)

## 2018-08-26 ENCOUNTER — Telehealth (HOSPITAL_COMMUNITY): Payer: Self-pay

## 2018-08-26 NOTE — Telephone Encounter (Signed)
Telephoned patient and left message about mammogram. Left name and number for her to call [facility]

## 2018-08-28 ENCOUNTER — Other Ambulatory Visit: Payer: Self-pay | Admitting: Nurse Practitioner

## 2018-08-28 DIAGNOSIS — E1169 Type 2 diabetes mellitus with other specified complication: Secondary | ICD-10-CM

## 2018-08-28 MED ORDER — ATORVASTATIN CALCIUM 40 MG PO TABS: 40.0000 mg | ORAL_TABLET | Freq: Every day | ORAL | 0 refills | Status: DC

## 2018-08-28 MED ORDER — VITAMIN D (ERGOCALCIFEROL) 1.25 MG (50000 UNIT) PO CAPS: 50000.0000 [IU] | ORAL_CAPSULE | ORAL | 0 refills | Status: DC

## 2018-09-01 ENCOUNTER — Telehealth: Payer: Self-pay

## 2018-09-01 MED FILL — CYCLOBENZAPRINE 5 MG TABLET: 5 | 20 days supply | Qty: 60 | Fill #0

## 2018-09-01 MED FILL — ATORVASTATIN 20 MG TABLET: 20 | 30 days supply | Qty: 30 | Fill #3

## 2018-09-01 MED FILL — metFORMIN HCL 1000 MG TABS: 1000 | 30 days supply | Qty: 60 | Fill #3

## 2018-09-01 MED FILL — GLIMEPIRIDE 4 MG TABS: 4 | 30 days supply | Qty: 30 | Fill #3

## 2018-09-01 MED FILL — LISINOPRIL 5 MG TAB: 5 | 30 days supply | Qty: 30 | Fill #3

## 2018-09-01 NOTE — Telephone Encounter (Signed)
-----   Message from Gildardo Pounds, NP sent at 08/28/2018  6:01 PM EDT ----- Labs do not show anemia. Kidney function is normal. Cholesterol levels are very elevated. Will need to increase your cholesterol medication to 40 mg. Please make sure you take your cholesterol medication everyday. High cholesterol levels can increase your risk of heart attack or stroke. VItamin D levels are still lower than normal. Continue vitamin D one capsule per week  for the next 3 months.

## 2018-09-01 NOTE — Telephone Encounter (Signed)
CMA attempt to reach patient to inform on results.   No answer and left a VM for patient for a call back.

## 2018-09-01 NOTE — Telephone Encounter (Signed)
Patient daughter Deatra Canter returned the phone call and was inform on lab results and medication changes.

## 2018-10-03 ENCOUNTER — Other Ambulatory Visit: Payer: Self-pay | Admitting: Physician Assistant

## 2018-10-03 DIAGNOSIS — E1165 Type 2 diabetes mellitus with hyperglycemia: Secondary | ICD-10-CM

## 2018-10-03 MED FILL — metFORMIN HCL 1000 MG TABS: 1000 | 30 days supply | Qty: 60 | Fill #4

## 2018-10-03 MED FILL — CYCLOBENZAPRINE 5 MG TABLET: 5 | 20 days supply | Qty: 60 | Fill #1

## 2018-10-03 MED FILL — LISINOPRIL 5 MG TAB: 5 | 30 days supply | Qty: 30 | Fill #4

## 2018-10-16 ENCOUNTER — Ambulatory Visit (HOSPITAL_COMMUNITY)
Admission: EM | Admit: 2018-10-16 | Discharge: 2018-10-16 | Disposition: A | Discharge status: Home / Self Care | Payer: BLUE CROSS/BLUE SHIELD | Attending: Family Medicine | Admitting: Family Medicine

## 2018-10-16 ENCOUNTER — Encounter (HOSPITAL_COMMUNITY): Payer: Self-pay | Admitting: *Deleted

## 2018-10-16 ENCOUNTER — Ambulatory Visit (INDEPENDENT_AMBULATORY_CARE_PROVIDER_SITE_OTHER): Payer: BLUE CROSS/BLUE SHIELD

## 2018-10-16 ENCOUNTER — Other Ambulatory Visit: Payer: Self-pay

## 2018-10-16 DIAGNOSIS — E119 Type 2 diabetes mellitus without complications: Secondary | ICD-10-CM | POA: Diagnosis not present

## 2018-10-16 DIAGNOSIS — M7989 Other specified soft tissue disorders: Secondary | ICD-10-CM | POA: Diagnosis not present

## 2018-10-16 DIAGNOSIS — L0390 Cellulitis, unspecified: Secondary | ICD-10-CM

## 2018-10-16 HISTORY — DX: Essential (primary) hypertension: I10

## 2018-10-16 MED ORDER — CEPHALEXIN 500 MG PO CAPS: 500.0000 mg | ORAL_CAPSULE | Freq: Four times a day (QID) | ORAL | 0 refills | Status: AC

## 2018-10-16 MED ORDER — NAPROXEN 500 MG PO TABS: 500.0000 mg | ORAL_TABLET | Freq: Two times a day (BID) | ORAL | 0 refills | Status: DC

## 2018-10-16 NOTE — Discharge Instructions (Signed)
Please begin Keflex 4 times a day for the next 5 days to cover for infection/cellulitis Naprosyn twice daily to help with swelling and pain Please elevate foot and ice Please wear Ace wrap to help with furthers swelling and compression  Please follow-up if symptoms not resolving with the above.  Please also follow-up if developing increased pain swelling redness or symptoms extending into lower leg/calf

## 2018-10-16 NOTE — ED Provider Notes (Signed)
[facility]    CSN: [id number] Arrival date & time: 10/16/18  1451     History   Chief Complaint Chief Complaint  Patient presents with  . Fall  . Foot Pain    HPI Christina Christina is a 58 y.o. female history of DM type II, hypertension, hyperlipidemia, presenting today for evaluation of left foot injury.  Patient fell and slipped at her house approximately 10 days ago.  Since she has had pain and swelling in her foot.  Majority of pain is located to the lateral aspect of her foot.  She has had some mild ankle pain.  She denies previous injury to her left foot.  Has previously injured her right foot/ankle and has rods present.  Because of this she has had chronic swelling, denies worsening swelling or changes to her feet recently.  Denies previous DVT/PE.  Denies tobacco use.  Denies exogenous estrogen.  Denies recent travel or immobilization.  HPI  Past Medical History:  Diagnosis Date  . Diabetes mellitus without complication ([place])   . Hyperlipidemia   . Hypertension     Patient Active Problem List   Diagnosis Date Noted  . Combined hyperlipidemia associated with type 2 diabetes mellitus ([place]) 07/06/2017  . Allergic rhinitis 01/27/2017  . Essential hypertension 01/27/2017  . Type 2 diabetes mellitus without complication, without long-term current use of insulin ([place]) 01/27/2017    Past Surgical History:  Procedure Laterality Date  . ANKLE SURGERY  2004  . CESAREAN SECTION     x2    OB History   No obstetric history on file.      Home Medications    Prior to Admission medications   Medication Sig Start Date End Date Taking? Authorizing Provider  atorvastatin (LIPITOR) 40 MG tablet Take 1 tablet (40 mg total) by mouth daily. 08/28/18 11/26/18 Yes Gildardo Pounds, NP  glimepiride (AMARYL) 4 MG tablet TAKE 1 TABLET BY MOUTH ONCE A DAY BEFORE BREAKFAST 10/03/18  Yes Gildardo Pounds, NP  lisinopril (PRINIVIL,ZESTRIL) 5 MG tablet Take 1 tablet (5 mg total)  by mouth daily. 05/18/18  Yes Freeman Caldron M, PA-C  metFORMIN (GLUCOPHAGE) 1000 MG tablet Take 1 tablet (1,000 mg total) by mouth 2 (two) times daily with a meal. 05/18/18  Yes McClung, Angela M, PA-C  acetaminophen (TYLENOL) 500 MG tablet Take 1 tablet (500 mg total) by mouth every 6 (six) hours as needed. Patient not taking: Reported on 07/06/2017 02/10/17   Gildardo Pounds, NP  Blood Glucose Monitoring Suppl (TRUE METRIX METER) w/Device KIT Use as instructed Patient not taking: Reported on 08/22/2018 07/06/17   Gildardo Pounds, NP  cephALEXin (KEFLEX) 500 MG capsule Take 1 capsule (500 mg total) by mouth 4 (four) times daily for 5 days. 10/16/18 10/21/18  Markel Kurtenbach C, PA-C  cyclobenzaprine (FLEXERIL) 5 MG tablet Take 1 tablet (5 mg total) by mouth 3 (three) times daily as needed for muscle spasms. 08/22/18   Gildardo Pounds, NP  glucose blood (TRUE METRIX BLOOD GLUCOSE TEST) test strip Use as instructed Patient not taking: Reported on 08/22/2018 07/06/17   Gildardo Pounds, NP  ibuprofen (ADVIL) 600 MG tablet Take 1 tablet (600 mg total) by mouth every 8 (eight) hours as needed. 08/22/18   Gildardo Pounds, NP  naproxen (NAPROSYN) 500 MG tablet Take 1 tablet (500 mg total) by mouth 2 (two) times daily. 10/16/18   Brylea Pita C, PA-C  TRUEPLUS LANCETS 28G MISC Use as instructed Patient  not taking: Reported on 08/22/2018 07/06/17   Gildardo Pounds, NP  Vitamin D, Ergocalciferol, (DRISDOL) 1.25 MG (50000 UT) CAPS capsule Take 1 capsule (50,000 Units total) by mouth every 7 (seven) days. 08/28/18   Gildardo Pounds, NP    Family History Family History  Problem Relation Age of Onset  . Diabetes Mother     Social History Social History   Tobacco Use  . Smoking status: Never Smoker  . Smokeless tobacco: Never Used  Substance Use Topics  . Alcohol use: No    Frequency: Never  . Drug use: No     Allergies   Patient has no known allergies.   Review of Systems Review of Systems   Constitutional: Negative for fatigue and fever.  HENT: Negative for mouth sores.   Eyes: Negative for visual disturbance.  Respiratory: Negative for shortness of breath.   Cardiovascular: Negative for chest pain.  Gastrointestinal: Negative for abdominal pain, nausea and vomiting.  Genitourinary: Negative for genital sores.  Musculoskeletal: Positive for arthralgias, gait problem and joint swelling.  Skin: Negative for color change, rash and wound.  Neurological: Negative for dizziness, weakness, light-headedness and headaches.     Physical Exam Triage Vital Signs ED Triage Vitals  Enc Vitals Group     BP 10/16/18 1520 (!) 145/92     Pulse Rate 10/16/18 1520 94     Resp 10/16/18 1520 16     Temp 10/16/18 1520 98.1 F (36.7 C)     Temp Source 10/16/18 1520 Oral     SpO2 10/16/18 1520 99 %     Weight --      Height --      Head Circumference --      Peak Flow --      Pain Score 10/16/18 1518 8     Pain Loc --      Pain Edu? --      Excl. in [place]? --    No data found.  Updated Vital Signs BP (!) 145/92 Comment: states took HTN med just PTA  Pulse 94   Temp 98.1 F (36.7 C) (Oral)   Resp 16   LMP  (LMP Unknown)   SpO2 99%   Visual Acuity Right Eye Distance:   Left Eye Distance:   Bilateral Distance:    Right Eye Near:   Left Eye Near:    Bilateral Near:     Physical Exam Vitals signs and nursing note reviewed.  Constitutional:      Appearance: She is well-developed.     Comments: No acute distress  HENT:     Head: Normocephalic and atraumatic.     Nose: Nose normal.  Eyes:     Conjunctiva/sclera: Conjunctivae normal.  Neck:     Musculoskeletal: Neck supple.  Cardiovascular:     Rate and Rhythm: Normal rate.  Pulmonary:     Effort: Pulmonary effort is normal. No respiratory distress.  Abdominal:     General: There is no distension.  Musculoskeletal: Normal range of motion.  Skin:    General: Skin is warm and dry.     Comments: Left foot/ankle:  Dorsum of left foot appears swollen, slightly erythematous, tenderness to palpation along lateral aspect of dorsum of foot along fifth metatarsal.  Nontender to palpation over medial and lateral malleolus, does have swelling in this area, but appears symmetric compared to left.  Dorsalis pedis 2+, sensation intact distally  No calf swelling erythema or tenderness, negative Homans  Neurological:  Mental Status: She is alert and oriented to person, place, and time.      UC Treatments / Results  Labs (all labs ordered are listed, but only abnormal results are displayed) Labs Reviewed - No data to display  EKG   Radiology Dg Foot Complete Left  Result Date: 10/16/2018 CLINICAL DATA:  Pain status post fall EXAM: LEFT FOOT - COMPLETE 3+ VIEW COMPARISON:  None. FINDINGS: There is soft tissue swelling about the forefoot without evidence for an underlying displaced fracture or dislocation. There is a moderate-sized plantar calcaneal spur. There is no radiopaque foreign body. IMPRESSION: 1. Soft tissue swelling without evidence for an acute osseous abnormality. 2. If an occult fracture is suspected, follow-up radiographs are recommended in 10-14 days. Electronically Signed   By: Constance Holster M.D.   On: 10/16/2018 16:05    Procedures Procedures (including critical care time)  Medications Ordered in UC Medications - No data to display  Initial Impression / Assessment and Plan / UC Course  I have reviewed the triage vital signs and the nursing notes.  Pertinent labs & imaging results that were available during my care of the patient were reviewed by me and considered in my medical decision making (see chart for details).    X-ray negative for acute bony abnormality.  Does have soft tissue swelling, given faint erythema and slight warmth will go and cover for cellulitis given her history of diabetes.  Will treat with Keflex.  Naprosyn for swelling and inflammation.  Ice and elevate.   Ace wrap.  Do not suspect DVT at this time, but advised patient to follow-up if symptoms progressing or worsening, not resolving.Discussed strict return precautions. Patient verbalized understanding and is agreeable with plan.  Final Clinical Impressions(s) / UC Diagnoses   Final diagnoses:  Swelling of left foot     Discharge Instructions     Please begin Keflex 4 times a day for the next 5 days to cover for infection/cellulitis Naprosyn twice daily to help with swelling and pain Please elevate foot and ice Please wear Ace wrap to help with furthers swelling and compression  Please follow-up if symptoms not resolving with the above.  Please also follow-up if developing increased pain swelling redness or symptoms extending into lower leg/calf    ED Prescriptions    Medication Sig Dispense Auth. Provider   cephALEXin (KEFLEX) 500 MG capsule Take 1 capsule (500 mg total) by mouth 4 (four) times daily for 5 days. 20 capsule Delyle Weider C, PA-C   naproxen (NAPROSYN) 500 MG tablet Take 1 tablet (500 mg total) by mouth 2 (two) times daily. 30 tablet Ubah Radke, [place] C, PA-C     Controlled Substance Prescriptions [place] Controlled Substance Registry consulted? Not Applicable   Janith Lima, [place] 10/16/18 1700

## 2018-10-16 NOTE — ED Triage Notes (Signed)
Pt reports falling down some stairs approx 10 days ago; c/o continued pain to left dorsal lateral foot and left ankle.  Swelling noted.  DP pulse 2+.  CMS intact.

## 2018-11-22 ENCOUNTER — Encounter: Payer: Self-pay | Admitting: Nurse Practitioner

## 2018-11-22 ENCOUNTER — Other Ambulatory Visit: Payer: Self-pay

## 2018-11-22 ENCOUNTER — Ambulatory Visit: Payer: Self-pay | Attending: Nurse Practitioner | Admitting: Nurse Practitioner

## 2018-11-22 ENCOUNTER — Ambulatory Visit (HOSPITAL_BASED_OUTPATIENT_CLINIC_OR_DEPARTMENT_OTHER): Payer: Self-pay | Admitting: Pharmacist

## 2018-11-22 VITALS — BP 137/81 | HR 84 | Temp 99.1°F | Ht 69.0 in | Wt 256.8 lb

## 2018-11-22 DIAGNOSIS — E559 Vitamin D deficiency, unspecified: Secondary | ICD-10-CM

## 2018-11-22 DIAGNOSIS — E782 Mixed hyperlipidemia: Secondary | ICD-10-CM

## 2018-11-22 DIAGNOSIS — E1165 Type 2 diabetes mellitus with hyperglycemia: Secondary | ICD-10-CM

## 2018-11-22 DIAGNOSIS — I10 Essential (primary) hypertension: Secondary | ICD-10-CM

## 2018-11-22 DIAGNOSIS — Z23 Encounter for immunization: Secondary | ICD-10-CM

## 2018-11-22 DIAGNOSIS — E1169 Type 2 diabetes mellitus with other specified complication: Secondary | ICD-10-CM

## 2018-11-22 DIAGNOSIS — Z1211 Encounter for screening for malignant neoplasm of colon: Secondary | ICD-10-CM

## 2018-11-22 LAB — POCT GLYCOSYLATED HEMOGLOBIN (HGB A1C): Hemoglobin A1C: 10 % — AB (ref 4.0–5.6)

## 2018-11-22 LAB — GLUCOSE, POCT (MANUAL RESULT ENTRY)
POC Glucose: 319 mg/dl — AB (ref 70–99)
POC Glucose: 241 mg/dl — AB (ref 70–99)

## 2018-11-22 MED ORDER — MICROLET LANCETS MISC: 12 refills | Status: DC

## 2018-11-22 MED ORDER — CONTOUR NEXT MONITOR W/DEVICE KIT: 1.0000 | PACK | Freq: Every day | 0 refills | Status: DC

## 2018-11-22 MED ORDER — CONTOUR NEXT TEST VI STRP: ORAL_STRIP | 12 refills | Status: DC

## 2018-11-22 MED ORDER — INSULIN ASPART 100 UNIT/ML ~~LOC~~ SOLN
10.0000 [IU] | Freq: Once | SUBCUTANEOUS | Status: AC
  Administered 2018-11-22: 12:00:00 10 [IU] via SUBCUTANEOUS

## 2018-11-22 MED ORDER — TRUE METRIX METER W/DEVICE KIT: PACK | 0 refills | Status: DC

## 2018-11-22 MED ORDER — TRUEPLUS LANCETS 28G MISC: 3 refills | Status: DC

## 2018-11-22 MED ORDER — MICROLET NEXT LANCING DEVICE MISC: 1.0000 | Freq: Every day | 0 refills | Status: DC

## 2018-11-22 MED ORDER — GLIMEPIRIDE 4 MG PO TABS: ORAL_TABLET | ORAL | 2 refills | Status: DC

## 2018-11-22 MED ORDER — ATORVASTATIN CALCIUM 40 MG PO TABS: 40.0000 mg | ORAL_TABLET | Freq: Every day | ORAL | 0 refills | Status: DC

## 2018-11-22 MED ORDER — LISINOPRIL 10 MG PO TABS: 10.0000 mg | ORAL_TABLET | Freq: Every day | ORAL | 1 refills | Status: DC

## 2018-11-22 MED ORDER — METFORMIN HCL 1000 MG PO TABS: 1000.0000 mg | ORAL_TABLET | Freq: Two times a day (BID) | ORAL | 1 refills | Status: DC

## 2018-11-22 MED ORDER — TRUE METRIX BLOOD GLUCOSE TEST VI STRP: ORAL_STRIP | 12 refills | Status: DC

## 2018-11-22 MED FILL — LISINOPRIL 10 MG TABS: 10 | 90 days supply | Qty: 90 | Fill #0

## 2018-11-22 MED FILL — metFORMIN HCL 1000 MG TABS: 1000 | 90 days supply | Qty: 180 | Fill #0

## 2018-11-22 MED FILL — ATORVASTATIN CALCIUM 40 MG: 40 | 90 days supply | Qty: 90 | Fill #0

## 2018-11-22 MED FILL — CONTOUR NEXT STRIPS: 50 days supply | Qty: 100 | Fill #0

## 2018-11-22 MED FILL — GLIMEPIRIDE 4 MG TABS: 4 | 90 days supply | Qty: 90 | Fill #0

## 2018-11-22 MED FILL — MICROLET LANCETS MISC: 50 days supply | Qty: 100 | Fill #0

## 2018-11-22 MED FILL — CONTOUR NEXT METER: W/DEVICE | 30 days supply | Qty: 1 | Fill #0

## 2018-11-22 NOTE — Progress Notes (Signed)
Patient presents for vaccination against influenza and S. pneumo (PPSV-23) per orders of Zelda. Consent given. Counseling provided. No contraindications exists. Vaccine administered without incident.

## 2018-11-22 NOTE — Progress Notes (Signed)
Assessment & Plan:  Christina Fields was seen today for follow-up.  Diagnoses and all orders for this visit:  Type 2 diabetes mellitus with hyperglycemia, without long-term current use of insulin (HCC) -     Glucose (CBG) -     HgB A1c -     insulin aspart (novoLOG) injection 10 Units -     glimepiride (AMARYL) 4 MG tablet; TAKE 1 TABLET BY MOUTH ONCE A DAY BEFORE BREAKFAST. Please fill as a 90 day supply -     glucose blood (TRUE METRIX BLOOD GLUCOSE TEST) test strip; Use as instructed -     metFORMIN (GLUCOPHAGE) 1000 MG tablet; Take 1 tablet (1,000 mg total) by mouth 2 (two) times daily with a meal. Please fill as a 90 day supply -     TRUEplus Lancets 28G MISC; Use as instructed -     Glucose (CBG) -     Blood Glucose Monitoring Suppl (TRUE METRIX METER) w/Device KIT; Use as instructed Continue blood sugar control as discussed in office today, low carbohydrate diet, and regular physical exercise as tolerated, 150 minutes per week (30 min each day, 5 days per week, or 50 min 3 days per week). Keep blood sugar logs with fasting goal of 90-130 mg/dl, post prandial (after you eat) less than 180.  For Hypoglycemia: BS <60 and Hyperglycemia BS >400; contact the clinic ASAP. Annual eye exams and foot exams are recommended.   Combined hyperlipidemia associated with type 2 diabetes mellitus (HCC) -     atorvastatin (LIPITOR) 40 MG tablet; Take 1 tablet (40 mg total) by mouth daily. Please fill as a 90 day supply -     Lipid panel INSTRUCTIONS: Work on a low fat, heart healthy diet and participate in regular aerobic exercise program by working out at least 150 minutes per week; 5 days a week-30 minutes per day. Avoid red meat, fried foods. junk foods, sodas, sugary drinks, unhealthy snacking, alcohol and smoking.  Drink at least 48oz of water per day and monitor your carbohydrate intake daily.    Essential hypertension -     lisinopril (ZESTRIL) 10 MG tablet; Take 1 tablet (10 mg total) by mouth daily.  Please fill as a 90 day supply -     Basic Metabolic Panel Continue all antihypertensives as prescribed.  Remember to bring in your blood pressure log with you for your follow up appointment.  DASH/Mediterranean Diets are healthier choices for HTN.    Vitamin D deficiency disease -     VITAMIN D 25 Hydroxy (Vit-D Deficiency, Fractures)  Colon cancer screening -     Fecal occult blood, imunochemical([facility]); Future -     Fecal occult blood, imunochemical([facility])    Patient has been counseled on age-appropriate routine health concerns for screening and prevention. These are reviewed and up-to-date. Referrals have been placed accordingly. Immunizations are up-to-date or declined.    Subjective:   Chief Complaint  Patient presents with  . Follow-up    Pt. is here follow up on diabetes.    HPI Christina Fields 58 y.o. female presents to office today for DM follow up.  has a past medical history of Diabetes mellitus without complication ([place]), Hyperlipidemia, and Hypertension.   Mammogram She was referred to the [facility] for breast cancer screening today.  Colonoscopy FOBT take home test given to patient in office today.    DM TYPE 2 A1c has significantly worsened.  Her weight is up and she is not exercising.  Patient endorses  nonadherence to metformin and reports only taking metformin once a day.  States she was not able to pick her glimepiride up last month as she was instructed by the pharmacy that she did not have any refills (there were 2 remaining) so she has been without her glimepiride.  She is not monitoring her blood glucose levels at home.  She denies any hypo-or hyperglycemic symptoms.  I will refill her glimepiride and metformin today.  She is aware that she should be taking her metformin twice daily and will need to follow-up in a few weeks for meter check.  If blood sugars are consistently greater than 200 we may need to start victoza or insulin. She  verbalized agreement although she states she does not want to have to start injections. She endorses eating lots of fruits: dates, plums and watermelon late night snack last night. I have instructed her that she can not eat all these fruits  Together in one sitting as it will cause her blood sugars to be too high. Glucose is 310 today and she required 10 units.  Lab Results  Component Value Date   HGBA1C 10.0 (A) 11/22/2018   Lab Results  Component Value Date   HGBA1C 7.8 (A) 08/22/2018   Essential Hypertension Increased lisinopril from 5 mg to 10 mg today for tighter control. Denies chest pain, shortness of breath, palpitations, lightheadedness, dizziness, headaches or BLE edema. She does not monitor her blood pressures at home.  BP Readings from Last 3 Encounters:  11/22/18 137/81  10/16/18 (!) 145/92  08/22/18 112/70   Hyperlipidemia Patient presents for follow up to poorly controlled hyperlipidemia.  She is medication compliant taking then 40 mg daily. She is not diet compliant and denies statin intolerance including myalgias.  Lab Results  Component Value Date   CHOL 210 (H) 08/22/2018   Lab Results  Component Value Date   HDL 48 08/22/2018   Lab Results  Component Value Date   LDLCALC 102 (H) 08/22/2018   Lab Results  Component Value Date   TRIG 302 (H) 08/22/2018   Lab Results  Component Value Date   CHOLHDL 4.4 08/22/2018   Review of Systems  Constitutional: Negative for fever, malaise/fatigue and weight loss.  HENT: Negative.  Negative for nosebleeds.   Eyes: Negative.  Negative for blurred vision, double vision and photophobia.  Respiratory: Negative.  Negative for cough and shortness of breath.   Cardiovascular: Negative.  Negative for chest pain, palpitations and leg swelling.  Gastrointestinal: Negative.  Negative for heartburn, nausea and vomiting.  Musculoskeletal: Negative.  Negative for myalgias.  Neurological: Negative.  Negative for dizziness, focal  weakness, seizures and headaches.  Psychiatric/Behavioral: Negative.  Negative for suicidal ideas.    Past Medical History:  Diagnosis Date  . Diabetes mellitus without complication ([place])   . Hyperlipidemia   . Hypertension     Past Surgical History:  Procedure Laterality Date  . ANKLE SURGERY  2004  . CESAREAN SECTION     x2    Family History  Problem Relation Age of Onset  . Diabetes Mother     Social History Reviewed with no changes to be made today.   Outpatient Medications Prior to Visit  Medication Sig Dispense Refill  . cyclobenzaprine (FLEXERIL) 5 MG tablet Take 1 tablet (5 mg total) by mouth 3 (three) times daily as needed for muscle spasms. 60 tablet 1  . naproxen (NAPROSYN) 500 MG tablet Take 1 tablet (500 mg total) by mouth 2 (two) times  daily. 30 tablet 0  . Vitamin D, Ergocalciferol, (DRISDOL) 1.25 MG (50000 UT) CAPS capsule Take 1 capsule (50,000 Units total) by mouth every 7 (seven) days. 12 capsule 0  . atorvastatin (LIPITOR) 40 MG tablet Take 1 tablet (40 mg total) by mouth daily. 90 tablet 0  . glimepiride (AMARYL) 4 MG tablet TAKE 1 TABLET BY MOUTH ONCE A DAY BEFORE BREAKFAST 30 tablet 2  . glucose blood (TRUE METRIX BLOOD GLUCOSE TEST) test strip Use as instructed 100 each 12  . lisinopril (PRINIVIL,ZESTRIL) 5 MG tablet Take 1 tablet (5 mg total) by mouth daily. 30 tablet 5  . metFORMIN (GLUCOPHAGE) 1000 MG tablet Take 1 tablet (1,000 mg total) by mouth 2 (two) times daily with a meal. 60 tablet 5  . acetaminophen (TYLENOL) 500 MG tablet Take 1 tablet (500 mg total) by mouth every 6 (six) hours as needed. (Patient not taking: Reported on 07/06/2017) 30 tablet 0  . ibuprofen (ADVIL) 600 MG tablet Take 1 tablet (600 mg total) by mouth every 8 (eight) hours as needed. (Patient not taking: Reported on 11/22/2018) 60 tablet 0  . Blood Glucose Monitoring Suppl (TRUE METRIX METER) w/Device KIT Use as instructed (Patient not taking: Reported on 11/22/2018) 1 kit 0  .  TRUEPLUS LANCETS 28G MISC Use as instructed (Patient not taking: Reported on 08/22/2018) 100 each 3   No facility-administered medications prior to visit.     No Known Allergies     Objective:    BP 137/81 (BP Location: Left Arm, Patient Position: Sitting, Cuff Size: Large)   Pulse 84   Temp 99.1 F (37.3 C) (Oral)   Ht '5\' 9"'$  (1.753 m)   Wt 256 lb 12.8 oz (116.5 kg)   LMP  (LMP Unknown)   SpO2 99%   BMI 37.92 kg/m  Wt Readings from Last 3 Encounters:  11/22/18 256 lb 12.8 oz (116.5 kg)  08/22/18 251 lb 12.8 oz (114.2 kg)  05/18/18 253 lb 12.8 oz (115.1 kg)    Physical Exam Vitals signs and nursing note reviewed.  Constitutional:      Appearance: She is well-developed.  HENT:     Head: Normocephalic and atraumatic.  Neck:     Musculoskeletal: Normal range of motion.  Cardiovascular:     Rate and Rhythm: Normal rate and regular rhythm.     Heart sounds: Normal heart sounds. No murmur. No friction rub. No gallop.   Pulmonary:     Effort: Pulmonary effort is normal. No tachypnea or respiratory distress.     Breath sounds: Normal breath sounds. No decreased breath sounds, wheezing, rhonchi or rales.  Chest:     Chest wall: No tenderness.  Abdominal:     General: Bowel sounds are normal.     Palpations: Abdomen is soft.  Musculoskeletal: Normal range of motion.  Skin:    General: Skin is warm and dry.  Neurological:     Mental Status: She is alert and oriented to person, place, and time.     Coordination: Coordination normal.  Psychiatric:        Behavior: Behavior normal. Behavior is cooperative.        Thought Content: Thought content normal.        Judgment: Judgment normal.          Patient has been counseled extensively about nutrition and exercise as well as the importance of adherence with medications and regular follow-up. The patient was given clear instructions to go to ER or return to  medical center if symptoms don't improve, worsen or new problems  develop. The patient verbalized understanding.   Follow-up: Return in about 4 weeks (around 12/20/2018) for [place] then 3 months with me.   Gildardo Pounds, FNP-BC [facility] [place] [facility] [place], [place]   11/22/2018, 12:34 PM

## 2018-11-22 NOTE — Addendum Note (Signed)
Addended by: Geryl Rankins on: 11/22/2018 03:38 PM   Modules accepted: Orders

## 2018-11-22 NOTE — Patient Instructions (Addendum)
Heart Healthy Vitamins  [id number] Omega 3 fish Oil  You should be taking at least 1200 mg of calcium and 800 mg of vitamin D daily for osteoporosis prevention

## 2018-11-23 ENCOUNTER — Other Ambulatory Visit: Payer: Self-pay | Admitting: Nurse Practitioner

## 2018-11-23 LAB — BASIC METABOLIC PANEL
BUN/Creatinine Ratio: 20 (ref 9–23)
BUN: 13 mg/dL (ref 6–24)
CO2: 25 mmol/L (ref 20–29)
Calcium: 9.5 mg/dL (ref 8.7–10.2)
Chloride: 99 mmol/L (ref 96–106)
Creatinine, Ser: 0.65 mg/dL (ref 0.57–1.00)
GFR calc Af Amer: 113 mL/min/{1.73_m2} (ref >59)
GFR calc non Af Amer: 98 mL/min/{1.73_m2} (ref >59)
Glucose: 260 mg/dL — ABNORMAL HIGH (ref 65–99)
Potassium: 4.4 mmol/L (ref 3.5–5.2)
Sodium: 139 mmol/L (ref 134–144)

## 2018-11-23 LAB — LIPID PANEL
Chol/HDL Ratio: 3.6 ratio (ref 0.0–4.4)
Cholesterol, Total: 196 mg/dL (ref 100–199)
HDL: 55 mg/dL (ref >39)
LDL Chol Calc (NIH): 104 mg/dL — ABNORMAL HIGH (ref 0–99)
Triglycerides: 219 mg/dL — ABNORMAL HIGH (ref 0–149)
VLDL Cholesterol Cal: 37 mg/dL (ref 5–40)

## 2018-11-23 LAB — VITAMIN D 25 HYDROXY (VIT D DEFICIENCY, FRACTURES): Vit D, 25-Hydroxy: 17.1 ng/mL — ABNORMAL LOW (ref 30.0–100.0)

## 2018-11-23 MED ORDER — VITAMIN D (ERGOCALCIFEROL) 1.25 MG (50000 UNIT) PO CAPS: 50000.0000 [IU] | ORAL_CAPSULE | ORAL | 0 refills | Status: DC

## 2018-11-24 ENCOUNTER — Telehealth: Payer: Self-pay | Admitting: Nurse Practitioner

## 2018-11-24 MED FILL — VIT D2 1.25 MG (50,000 UNIT: 1.25 MG | 84 days supply | Qty: 12 | Fill #0

## 2018-11-24 NOTE — Telephone Encounter (Signed)
Patient states she was called for results. Please follow up.

## 2018-11-25 NOTE — Telephone Encounter (Signed)
Spoke to patient. Pt. Was inform on lab results and PCP advising.  Pt. Understood and is aware of Rx.  Arabic interpreter assist with the call.

## 2018-12-21 ENCOUNTER — Other Ambulatory Visit: Payer: Self-pay

## 2018-12-21 ENCOUNTER — Ambulatory Visit: Payer: Self-pay | Attending: Nurse Practitioner | Admitting: Pharmacist

## 2018-12-21 DIAGNOSIS — E1165 Type 2 diabetes mellitus with hyperglycemia: Secondary | ICD-10-CM

## 2018-12-21 NOTE — Progress Notes (Signed)
    S:    PCP: Zelda  No chief complaint on file.  Patient arrives in good spirits.Presents for diabetes evaluation, education, and management Patient was referred and last seen by Primary Care Provider on 11/22/18.    Family/Social History:  - FHx: DM (mother) - Tobacco: never smoker  - Alcohol: denies use   Insurance coverage/medication affordability: self pay  Patient reports adherence with medications.  Current diabetes medications include: glimepiride 4 mg daily (takes in the middle of the day), metformin 1000 mg BID Current hypertension medications include: lisinopril 10 mg daily Current hyperlipidemia medications include: atorvastatin 40 mg daily  Patient denies hypoglycemic events.  Patient reported dietary habits:  - Admits to dietary indiscretion; loves sweets including cake  Patient-reported exercise habits:  - walks ~30 minutes daily   Patient reports polydipsia.  Patient reports neuropathy. Patient denies visual changes. Patient reports self foot exams.     O: POCT glucose: 168  Lab Results  Component Value Date   HGBA1C 10.0 (A) 11/22/2018   There were no vitals filed for this visit.  Lipid Panel     Component Value Date/Time   CHOL 196 11/22/2018 1217   TRIG 219 (H) 11/22/2018 1217   HDL 55 11/22/2018 1217   CHOLHDL 3.6 11/22/2018 1217   LDLCALC 104 (H) 11/22/2018 1217   Home fasting CBG: 180s - low 200s  2 hour post-prandial/random CBG: 200s   Clinical ASCVD: No  Calculated risk score: 9.7  A/P: Diabetes longstanding currently uncontrolled. Patient is able to verbalize appropriate hypoglycemia management plan. Patient is adherent with medication. Control is suboptimal due to dietary indiscretion. Pt is very reluctant to begin injectable therapy.  -Continue orals for now.  -Pt is aware that we will likely need additional therapy in the future. She will return to discuss this in 2 weeks.  -Start taking glimepiride in the morning.  -Extensively discussed pathophysiology of DM, recommended lifestyle interventions, dietary effects on glycemic control -Counseled on s/sx of and management of hypoglycemia -Next A1C anticipated 02/2019.   ASCVD risk - primary prevention in patient with DM. Last LDL is not controlled. ASCVD risk score is not >20%. Continue current statin therapy.  -Continued atorvastatin 40 mg.   HM: UTD on PNA and flu vaccines. Recommend tetanus shot at follow-up with me.   Written patient instructions provided.  Total time in face to face counseling 30 minutes.   Follow up Pharmacist Clinic Visit in 2 weeks.   Benard Halsted, PharmD, [place] [phone]

## 2018-12-21 NOTE — Patient Instructions (Signed)
Thank you for coming to see me today. Please do the following:  1. Continue current regimen.  2. Take glimepiride in the morning.  3. Continue metformin twice a day.  4. Continue checking blood sugars at home.  5. Continue making the lifestyle changes we've discussed together during our visit. Diet and exercise play a significant role in improving your blood sugars.  6. Follow-up with me 2 weeks.     Hypoglycemia or low blood sugar:   Low blood sugar can happen quickly and may become an emergency if not treated right away.   While this shouldn't happen often, it can be brought upon if you skip a meal or do not eat enough. Also, if your insulin or other diabetes medications are dosed too high, this can cause your blood sugar to go to low.   Warning signs of low blood sugar include: 1. Feeling shaky or dizzy 2. Feeling weak or tired  3. Excessive hunger 4. Feeling anxious or upset  5. Sweating even when you aren't exercising  What to do if I experience low blood sugar? 1. Check your blood sugar with your meter. If lower than 70, proceed to step 2.  2. Treat with 3-4 glucose tablets or 3 packets of regular sugar. If these aren't around, you can try hard candy. Yet another option would be to drink 4 ounces of fruit juice or 6 ounces of REGULAR soda.  3. Re-check your sugar in 15 minutes. If it is still below 70, do what you did in step 2 again. If has come back up, go ahead and eat a snack or small meal at this time.

## 2019-02-06 MED FILL — GLIMEPIRIDE 4 MG TABS: 4 | 90 days supply | Qty: 90 | Fill #1

## 2019-02-21 ENCOUNTER — Other Ambulatory Visit: Payer: Self-pay | Admitting: Nurse Practitioner

## 2019-02-21 DIAGNOSIS — E1169 Type 2 diabetes mellitus with other specified complication: Secondary | ICD-10-CM

## 2019-02-21 DIAGNOSIS — E782 Mixed hyperlipidemia: Secondary | ICD-10-CM

## 2019-02-21 MED FILL — LISINOPRIL 10 MG TABS: 10 | 90 days supply | Qty: 90 | Fill #1

## 2019-02-21 MED FILL — CONTOUR NEXT STRIPS: 50 days supply | Qty: 100 | Fill #1

## 2019-02-21 MED FILL — metFORMIN HCL 1000 MG TABS: 1000 | 90 days supply | Qty: 180 | Fill #1

## 2019-02-21 NOTE — Telephone Encounter (Signed)
Please fill if appropriate. Last checked 11/22/2018 with 90 day dispense.

## 2019-02-23 MED FILL — ATORVASTATIN CALCIUM 40 MG: 40 | 90 days supply | Qty: 90 | Fill #0

## 2019-03-15 ENCOUNTER — Ambulatory Visit: Payer: Self-pay | Admitting: Nurse Practitioner

## 2019-04-04 ENCOUNTER — Ambulatory Visit: Payer: BLUE CROSS/BLUE SHIELD | Attending: Nurse Practitioner | Admitting: Nurse Practitioner

## 2019-04-04 ENCOUNTER — Encounter: Payer: Self-pay | Admitting: Nurse Practitioner

## 2019-04-04 VITALS — BP 148/85 | HR 88 | Temp 97.8°F | Ht 69.0 in | Wt 252.0 lb

## 2019-04-04 DIAGNOSIS — Z1231 Encounter for screening mammogram for malignant neoplasm of breast: Secondary | ICD-10-CM

## 2019-04-04 DIAGNOSIS — E559 Vitamin D deficiency, unspecified: Secondary | ICD-10-CM

## 2019-04-04 DIAGNOSIS — I10 Essential (primary) hypertension: Secondary | ICD-10-CM

## 2019-04-04 DIAGNOSIS — E782 Mixed hyperlipidemia: Secondary | ICD-10-CM

## 2019-04-04 DIAGNOSIS — Z1211 Encounter for screening for malignant neoplasm of colon: Secondary | ICD-10-CM

## 2019-04-04 DIAGNOSIS — E1165 Type 2 diabetes mellitus with hyperglycemia: Secondary | ICD-10-CM

## 2019-04-04 DIAGNOSIS — E1169 Type 2 diabetes mellitus with other specified complication: Secondary | ICD-10-CM

## 2019-04-04 LAB — POCT URINALYSIS DIP (CLINITEK)
Glucose, UA: NEGATIVE mg/dL
Leukocytes, UA: NEGATIVE
Nitrite, UA: NEGATIVE
POC PROTEIN,UA: NEGATIVE
Spec Grav, UA: >=1.03 — AB (ref 1.010–1.025)
Urobilinogen, UA: 0.2 E.U./dL
pH, UA: 5 (ref 5.0–8.0)

## 2019-04-04 LAB — POCT GLYCOSYLATED HEMOGLOBIN (HGB A1C): Hemoglobin A1C: 8.6 % — AB (ref 4.0–5.6)

## 2019-04-04 LAB — GLUCOSE, POCT (MANUAL RESULT ENTRY): POC Glucose: 200 mg/dl — AB (ref 70–99)

## 2019-04-04 MED ORDER — MICROLET LANCETS MISC: 12 refills | Status: DC

## 2019-04-04 MED ORDER — LISINOPRIL 10 MG PO TABS: 10.0000 mg | ORAL_TABLET | Freq: Every day | ORAL | 1 refills | Status: DC

## 2019-04-04 MED ORDER — GLIMEPIRIDE 4 MG PO TABS: ORAL_TABLET | ORAL | 2 refills | Status: DC

## 2019-04-04 MED ORDER — CONTOUR NEXT TEST VI STRP: ORAL_STRIP | 12 refills | Status: DC

## 2019-04-04 MED ORDER — ATORVASTATIN CALCIUM 40 MG PO TABS: 40.0000 mg | ORAL_TABLET | Freq: Every day | ORAL | 0 refills | Status: DC

## 2019-04-04 MED ORDER — VITAMIN D (ERGOCALCIFEROL) 1.25 MG (50000 UNIT) PO CAPS: 50000.0000 [IU] | ORAL_CAPSULE | ORAL | 0 refills | Status: DC

## 2019-04-04 MED ORDER — METFORMIN HCL 1000 MG PO TABS: 1000.0000 mg | ORAL_TABLET | Freq: Two times a day (BID) | ORAL | 1 refills | Status: DC

## 2019-04-04 MED FILL — CONTOUR NEXT STRIPS: 50 days supply | Qty: 100 | Fill #0

## 2019-04-04 MED FILL — VIT D2 1.25 MG (50,000 UNIT: 1.25 MG | 84 days supply | Qty: 12 | Fill #0

## 2019-04-04 MED FILL — MICROLET LANCETS MISC: 50 days supply | Qty: 100 | Fill #0

## 2019-04-04 NOTE — Progress Notes (Signed)
Assessment & Plan:  Christina Fields was seen today for follow-up.  Diagnoses and all orders for this visit:  Type 2 diabetes mellitus with hyperglycemia, without long-term current use of insulin (HCC) -     Glucose (CBG) -     HgB A1c -     Microalbumin/Creatinine Ratio, Urine -     metFORMIN (GLUCOPHAGE) 1000 MG tablet; Take 1 tablet (1,000 mg total) by mouth 2 (two) times daily with a meal. Please fill as a 90 day supply -     glimepiride (AMARYL) 4 MG tablet; TAKE 1 TABLET BY MOUTH ONCE A DAY BEFORE BREAKFAST. Please fill as a 90 day supply -     glucose blood (CONTOUR NEXT TEST) test strip; Use as instructed. Check blood glucose level by fingerstick twice per day. E11.65 -     Microlet Lancets MISC; Use as instructed. Check blood glucose level by fingerstick twice per day. E11.65 -     POCT URINALYSIS DIP (CLINITEK) Continue blood sugar control as discussed in office today, low carbohydrate diet, and regular physical exercise as tolerated, 150 minutes per week (30 min each day, 5 days per week, or 50 min 3 days per week). Keep blood sugar logs with fasting goal of 90-130 mg/dl, post prandial (after you eat) less than 180.  For Hypoglycemia: BS <60 and Hyperglycemia BS >400; contact the clinic ASAP. Annual eye exams and foot exams are recommended.   Essential hypertension -     CMP14+EGFR -     lisinopril (ZESTRIL) 10 MG tablet; Take 1 tablet (10 mg total) by mouth daily. Please fill as a 90 day supply Continue all antihypertensives as prescribed.  Remember to bring in your blood pressure log with you for your follow up appointment.  DASH/Mediterranean Diets are healthier choices for HTN.    Combined hyperlipidemia associated with type 2 diabetes mellitus (HCC) -     atorvastatin (LIPITOR) 40 MG tablet; Take 1 tablet (40 mg total) by mouth daily. Please fill as a 90 day supply INSTRUCTIONS: Work on a low fat, heart healthy diet and participate in regular aerobic exercise program by working  out at least 150 minutes per week; 5 days a week-30 minutes per day. Avoid red meat/beef/steak,  fried foods. junk foods, sodas, sugary drinks, unhealthy snacking, alcohol and smoking.  Drink at least 80 oz of water per day and monitor your carbohydrate intake daily.    Breast cancer screening by mammogram -     MM 3D SCREEN BREAST BILATERAL; Future  Colon cancer screening -     Ambulatory referral to Gastroenterology  Vitamin D deficiency disease -     VITAMIN D 25 Hydroxy (Vit-D Deficiency, Fractures) -     Vitamin D, Ergocalciferol, (DRISDOL) 1.25 MG (50000 UNIT) CAPS capsule; Take 1 capsule (50,000 Units total) by mouth every 7 (seven) days.    Patient has been counseled on age-appropriate routine health concerns for screening and prevention. These are reviewed and up-to-date. Referrals have been placed accordingly. Immunizations are up-to-date or declined.    Subjective:   Chief Complaint  Patient presents with  . Follow-up    Pt. is here for a follow up.    HPI Christina Fields 59 y.o. female presents to office today for follow up.  has a past medical history of Diabetes mellitus without complication ([place]), Hyperlipidemia, and Hypertension.   VRI was used to communicate directly with patient for the entire encounter including providing detailed patient instructions.   Dizziness 10 days  ago  DM TYPE 2 Weight is stable however she is obese with BMI 37. Reports blood glucose readings at home: Fasting 150-160s Post prandial 200s. Related to lack of dietary adherence. She denies hypo or hyperglycemic symptoms such as neuropathy. She is currently taking glimepiride 4 mg daily and metformin 1000 mg BID. On ACE (lisinopril 10 mg) and STATIN (atorvastatin 40 mg). LDL not at goal of <70. Blood pressure not well controlled today and likely related to her weight gain and diet.  Lab Results  Component Value Date   HGBA1C 8.6 (A) 04/04/2019   Lab Results  Component Value Date   LDLCALC  104 (H) 11/22/2018   BP Readings from Last 3 Encounters:  04/04/19 (!) 148/85  11/22/18 137/81  10/16/18 (!) 145/92    Review of Systems  Constitutional: Negative for fever, malaise/fatigue and weight loss.  HENT: Negative.  Negative for nosebleeds.   Eyes: Negative.  Negative for blurred vision, double vision and photophobia.  Respiratory: Negative.  Negative for cough and shortness of breath.   Cardiovascular: Negative.  Negative for chest pain, palpitations and leg swelling.  Gastrointestinal: Negative.  Negative for heartburn, nausea and vomiting.  Musculoskeletal: Negative.  Negative for myalgias.  Neurological: Negative.  Negative for dizziness, focal weakness, seizures and headaches.  Psychiatric/Behavioral: Negative.  Negative for suicidal ideas.    Past Medical History:  Diagnosis Date  . Diabetes mellitus without complication ([place])   . Hyperlipidemia   . Hypertension     Past Surgical History:  Procedure Laterality Date  . ANKLE SURGERY  2004  . CESAREAN SECTION     x2    Family History  Problem Relation Age of Onset  . Diabetes Mother     Social History Reviewed with no changes to be made today.   Outpatient Medications Prior to Visit  Medication Sig Dispense Refill  . Blood Glucose Monitoring Suppl (CONTOUR NEXT MONITOR) w/Device KIT 1 kit by Does not apply route daily. 1 kit 0  . cyclobenzaprine (FLEXERIL) 5 MG tablet Take 1 tablet (5 mg total) by mouth 3 (three) times daily as needed for muscle spasms. 60 tablet 1  . Lancet Devices (MICROLET NEXT LANCING DEVICE) MISC 1 Device by Does not apply route daily. 1 each 0  . naproxen (NAPROSYN) 500 MG tablet Take 1 tablet (500 mg total) by mouth 2 (two) times daily. 30 tablet 0  . atorvastatin (LIPITOR) 40 MG tablet TAKE 1 TABLET (40 MG TOTAL) BY MOUTH DAILY. PLEASE FILL AS A 90 DAY SUPPLY 90 tablet 0  . glimepiride (AMARYL) 4 MG tablet TAKE 1 TABLET BY MOUTH ONCE A DAY BEFORE BREAKFAST. Please fill as a 90 day  supply 90 tablet 2  . glucose blood (CONTOUR NEXT TEST) test strip Use as instructed. Check blood glucose level by fingerstick twice per day. 100 each 12  . Microlet Lancets MISC Use as instructed. Check blood glucose level by fingerstick twice per day. 100 each 12  . acetaminophen (TYLENOL) 500 MG tablet Take 1 tablet (500 mg total) by mouth every 6 (six) hours as needed. (Patient not taking: Reported on 07/06/2017) 30 tablet 0  . ibuprofen (ADVIL) 600 MG tablet Take 1 tablet (600 mg total) by mouth every 8 (eight) hours as needed. (Patient not taking: Reported on 11/22/2018) 60 tablet 0  . lisinopril (ZESTRIL) 10 MG tablet Take 1 tablet (10 mg total) by mouth daily. Please fill as a 90 day supply 90 tablet 1  . metFORMIN (GLUCOPHAGE) 1000 MG  tablet Take 1 tablet (1,000 mg total) by mouth 2 (two) times daily with a meal. Please fill as a 90 day supply 180 tablet 1  . Vitamin D, Ergocalciferol, (DRISDOL) 1.25 MG (50000 UT) CAPS capsule Take 1 capsule (50,000 Units total) by mouth every 7 (seven) days. (Patient not taking: Reported on 04/04/2019) 12 capsule 0   No facility-administered medications prior to visit.    No Known Allergies     Objective:    BP (!) 148/85 (BP Location: Right Arm, Patient Position: Sitting, Cuff Size: Large)   Pulse 88   Temp 97.8 F (36.6 C) (Oral)   Ht '5\' 9"'$  (1.753 m)   Wt 252 lb (114.3 kg)   LMP  (LMP Unknown)   SpO2 98%   BMI 37.21 kg/m  Wt Readings from Last 3 Encounters:  04/04/19 252 lb (114.3 kg)  11/22/18 256 lb 12.8 oz (116.5 kg)  08/22/18 251 lb 12.8 oz (114.2 kg)    Physical Exam Vitals and nursing note reviewed.  Constitutional:      Appearance: She is well-developed.  HENT:     Head: Normocephalic and atraumatic.  Cardiovascular:     Rate and Rhythm: Normal rate and regular rhythm.     Heart sounds: Normal heart sounds. No murmur. No friction rub. No gallop.   Pulmonary:     Effort: Pulmonary effort is normal. No tachypnea or respiratory  distress.     Breath sounds: Normal breath sounds. No decreased breath sounds, wheezing, rhonchi or rales.  Chest:     Chest wall: No tenderness.  Abdominal:     General: Bowel sounds are normal.     Palpations: Abdomen is soft.  Musculoskeletal:        General: Normal range of motion.     Cervical back: Normal range of motion.  Skin:    General: Skin is warm and dry.  Neurological:     Mental Status: She is alert and oriented to person, place, and time.     Coordination: Coordination normal.  Psychiatric:        Behavior: Behavior normal. Behavior is cooperative.        Thought Content: Thought content normal.        Judgment: Judgment normal.         Patient has been counseled extensively about nutrition and exercise as well as the importance of adherence with medications and regular follow-up. The patient was given clear instructions to go to ER or return to medical center if symptoms don't improve, worsen or new problems develop. The patient verbalized understanding.   Follow-up: Return in about 3 months (around 07/03/2019).   Gildardo Pounds, FNP-BC [facility] and [facility] [place], [place]

## 2019-04-05 LAB — CMP14+EGFR
ALT: 10 IU/L (ref 0–32)
AST: 11 IU/L (ref 0–40)
Albumin/Globulin Ratio: 1.4 (ref 1.2–2.2)
Albumin: 4.2 g/dL (ref 3.8–4.9)
Alkaline Phosphatase: 80 IU/L (ref 39–117)
BUN/Creatinine Ratio: 21 (ref 9–23)
BUN: 14 mg/dL (ref 6–24)
Bilirubin Total: <0.2 mg/dL (ref 0.0–1.2)
CO2: 24 mmol/L (ref 20–29)
Calcium: 9.6 mg/dL (ref 8.7–10.2)
Chloride: 100 mmol/L (ref 96–106)
Creatinine, Ser: 0.68 mg/dL (ref 0.57–1.00)
GFR calc Af Amer: 112 mL/min/{1.73_m2} (ref >59)
GFR calc non Af Amer: 97 mL/min/{1.73_m2} (ref >59)
Globulin, Total: 3.1 g/dL (ref 1.5–4.5)
Glucose: 165 mg/dL — ABNORMAL HIGH (ref 65–99)
Potassium: 4.1 mmol/L (ref 3.5–5.2)
Sodium: 139 mmol/L (ref 134–144)
Total Protein: 7.3 g/dL (ref 6.0–8.5)

## 2019-04-05 LAB — MICROALBUMIN / CREATININE URINE RATIO
Creatinine, Urine: 332.5 mg/dL
Microalb/Creat Ratio: 6 mg/g creat (ref 0–29)
Microalbumin, Urine: 19.7 ug/mL

## 2019-04-05 LAB — VITAMIN D 25 HYDROXY (VIT D DEFICIENCY, FRACTURES): Vit D, 25-Hydroxy: 23.3 ng/mL — ABNORMAL LOW (ref 30.0–100.0)

## 2019-04-06 ENCOUNTER — Encounter: Payer: Self-pay | Admitting: Nurse Practitioner

## 2019-05-10 ENCOUNTER — Other Ambulatory Visit: Payer: Self-pay | Admitting: Nurse Practitioner

## 2019-05-10 DIAGNOSIS — M791 Myalgia, unspecified site: Secondary | ICD-10-CM

## 2019-05-10 MED FILL — LISINOPRIL 5 MG TABLET: 5 | 30 days supply | Qty: 30 | Fill #5

## 2019-05-10 MED FILL — GLIMEPIRIDE 4 MG TABS: 4 | 90 days supply | Qty: 90 | Fill #2

## 2019-05-12 MED FILL — CYCLOBENZAPRINE 5 MG TABLET: 5 | 20 days supply | Qty: 60 | Fill #0

## 2019-05-22 MED FILL — CONTOUR NEXT STRIPS: 50 days supply | Qty: 100 | Fill #1

## 2019-06-02 ENCOUNTER — Ambulatory Visit: Payer: BLUE CROSS/BLUE SHIELD

## 2019-06-02 ENCOUNTER — Other Ambulatory Visit: Payer: Self-pay

## 2019-06-05 ENCOUNTER — Other Ambulatory Visit: Payer: Self-pay | Admitting: Physician Assistant

## 2019-06-05 MED FILL — metFORMIN HCL 1000 MG TABS: 1000 | 90 days supply | Qty: 180 | Fill #0

## 2019-06-06 MED FILL — LISINOPRIL 10 MG TABS: 10 | 90 days supply | Qty: 90 | Fill #0

## 2019-06-16 ENCOUNTER — Other Ambulatory Visit: Payer: Self-pay

## 2019-06-16 ENCOUNTER — Ambulatory Visit
Admission: RE | Admit: 2019-06-16 | Discharge: 2019-06-16 | Disposition: A | Discharge status: Home / Self Care | Payer: PRIVATE HEALTH INSURANCE | Source: Ambulatory Visit | Attending: Nurse Practitioner | Admitting: Nurse Practitioner

## 2019-06-16 DIAGNOSIS — Z1231 Encounter for screening mammogram for malignant neoplasm of breast: Secondary | ICD-10-CM

## 2019-07-03 ENCOUNTER — Ambulatory Visit: Payer: BLUE CROSS/BLUE SHIELD | Admitting: Nurse Practitioner

## 2019-07-25 ENCOUNTER — Encounter: Payer: Self-pay | Admitting: Nurse Practitioner

## 2019-07-25 ENCOUNTER — Ambulatory Visit: Payer: PRIVATE HEALTH INSURANCE | Attending: Nurse Practitioner | Admitting: Nurse Practitioner

## 2019-07-25 ENCOUNTER — Other Ambulatory Visit: Payer: Self-pay

## 2019-07-25 VITALS — BP 124/86 | HR 74 | Temp 97.7°F | Ht 69.0 in | Wt 246.0 lb

## 2019-07-25 DIAGNOSIS — E782 Mixed hyperlipidemia: Secondary | ICD-10-CM

## 2019-07-25 DIAGNOSIS — I10 Essential (primary) hypertension: Secondary | ICD-10-CM | POA: Diagnosis not present

## 2019-07-25 DIAGNOSIS — M791 Myalgia, unspecified site: Secondary | ICD-10-CM

## 2019-07-25 DIAGNOSIS — E1165 Type 2 diabetes mellitus with hyperglycemia: Secondary | ICD-10-CM | POA: Diagnosis not present

## 2019-07-25 DIAGNOSIS — B070 Plantar wart: Secondary | ICD-10-CM | POA: Diagnosis not present

## 2019-07-25 DIAGNOSIS — Z1211 Encounter for screening for malignant neoplasm of colon: Secondary | ICD-10-CM

## 2019-07-25 DIAGNOSIS — E1169 Type 2 diabetes mellitus with other specified complication: Secondary | ICD-10-CM

## 2019-07-25 LAB — POCT GLYCOSYLATED HEMOGLOBIN (HGB A1C): Hemoglobin A1C: 7.4 % — AB (ref 4.0–5.6)

## 2019-07-25 LAB — GLUCOSE, POCT (MANUAL RESULT ENTRY): POC Glucose: 185 mg/dl — AB (ref 70–99)

## 2019-07-25 MED ORDER — ATORVASTATIN CALCIUM 40 MG PO TABS: 40.0000 mg | ORAL_TABLET | Freq: Every day | ORAL | 0 refills | Status: DC

## 2019-07-25 MED ORDER — CONTOUR NEXT TEST VI STRP: ORAL_STRIP | 12 refills | Status: DC

## 2019-07-25 MED ORDER — MICROLET LANCETS MISC: 12 refills | Status: DC

## 2019-07-25 MED ORDER — GLIMEPIRIDE 4 MG PO TABS: ORAL_TABLET | ORAL | 2 refills | Status: DC

## 2019-07-25 MED ORDER — MICROLET NEXT LANCING DEVICE MISC: 1.0000 | Freq: Every day | 0 refills | Status: DC

## 2019-07-25 MED ORDER — CYCLOBENZAPRINE HCL 5 MG PO TABS: 5.0000 mg | ORAL_TABLET | Freq: Three times a day (TID) | ORAL | 1 refills | Status: DC | PRN

## 2019-07-25 MED ORDER — LISINOPRIL 10 MG PO TABS: 10.0000 mg | ORAL_TABLET | Freq: Every day | ORAL | 1 refills | Status: DC

## 2019-07-25 MED ORDER — METFORMIN HCL 1000 MG PO TABS: 1000.0000 mg | ORAL_TABLET | Freq: Two times a day (BID) | ORAL | 1 refills | Status: DC

## 2019-07-25 MED ORDER — FUROSEMIDE 20 MG PO TABS: 20.0000 mg | ORAL_TABLET | Freq: Every day | ORAL | 3 refills | Status: DC | PRN

## 2019-07-25 MED FILL — CYCLOBENZAPRINE 5 MG TABLET: 5 | 20 days supply | Qty: 60 | Fill #0

## 2019-07-25 MED FILL — ATORVASTATIN CALCIUM 40 MG: 40 | 90 days supply | Qty: 90 | Fill #0

## 2019-07-25 NOTE — Progress Notes (Signed)
Assessment & Plan:  Christina Fields was seen today for follow-up.  Diagnoses and all orders for this visit:  Type 2 diabetes mellitus with hyperglycemia, without long-term current use of insulin (HCC) -     Glucose (CBG) -     HgB A1c -     metFORMIN (GLUCOPHAGE) 1000 MG tablet; Take 1 tablet (1,000 mg total) by mouth 2 (two) times daily with a meal. Please fill as a 90 day supply -     Lancet Devices (MICROLET NEXT LANCING DEVICE) MISC; 1 Device by Does not apply route daily. -     Microlet Lancets MISC; Use as instructed. Check blood glucose level by fingerstick twice per day. E11.65 -     glucose blood (CONTOUR NEXT TEST) test strip; Use as instructed. Check blood glucose level by fingerstick twice per day. E11.65 -     glimepiride (AMARYL) 4 MG tablet; TAKE 1 TABLET BY MOUTH ONCE A DAY BEFORE BREAKFAST. Please fill as a 90 day supply Continue blood sugar control as discussed in office today, low carbohydrate diet, and regular physical exercise as tolerated, 150 minutes per week (30 min each day, 5 days per week, or 50 min 3 days per week). Keep blood sugar logs with fasting goal of 90-130 mg/dl, post prandial (after you eat) less than 180.  For Hypoglycemia: BS <60 and Hyperglycemia BS >400; contact the clinic ASAP. Annual eye exams and foot exams are recommended.   Colon cancer screening -     Fecal occult blood, imunochemical([facility])  Essential hypertension -     Basic metabolic panel -     lisinopril (ZESTRIL) 10 MG tablet; Take 1 tablet (10 mg total) by mouth daily. Please fill as a 90 day supply -     furosemide (LASIX) 20 MG tablet; Take 1 tablet (20 mg total) by mouth daily as needed for edema (leg swelling). Only take as needed for leg swelling Continue all antihypertensives as prescribed.  Remember to bring in your blood pressure log with you for your follow up appointment.  DASH/Mediterranean Diets are healthier choices for HTN.    Combined hyperlipidemia associated  with type 2 diabetes mellitus (HCC) -     atorvastatin (LIPITOR) 40 MG tablet; Take 1 tablet (40 mg total) by mouth daily. Please fill as a 90 day supply INSTRUCTIONS: Work on a low fat, heart healthy diet and participate in regular aerobic exercise program by working out at least 150 minutes per week; 5 days a week-30 minutes per day. Avoid red meat/beef/steak,  fried foods. junk foods, sodas, sugary drinks, unhealthy snacking, alcohol and smoking.  Drink at least 80 oz of water per day and monitor your carbohydrate intake daily.    Myalgia -     cyclobenzaprine (FLEXERIL) 5 MG tablet; Take 1 tablet (5 mg total) by mouth 3 (three) times daily as needed for muscle spasms.  Plantar warts -     Ambulatory referral to Podiatry    Patient has been counseled on age-appropriate routine health concerns for screening and prevention. These are reviewed and up-to-date. Referrals have been placed accordingly. Immunizations are up-to-date or declined.    Subjective:   Chief Complaint  Patient presents with   Follow-up    Pt. is here for HTN and diabetes.    HPI Christina Fields 59 y.o. female presents to office today for follow up VRI was used to communicate directly with patient for the entire encounter including providing detailed patient instructions.   DM  TYPE 2 Well controlled. Down from 7.4 from 8.6. Continue on glimepiride 4 mg daily and metformin 1000 mg BID. Denies any symptoms of hypo or hyperglycemia. Overdue for eye exam. Monitoring blood glucose levels 1-2 times per day. On STATIN and ACE. LDL not at goal of <70. Taking lisinopril 10 mg and atorvastatin 40 mg  daily. Blood pressure at goal.  Lab Results  Component Value Date   HGBA1C 7.4 (A) 07/25/2019   Lab Results  Component Value Date   LDLCALC 104 (H) 11/22/2018   BP Readings from Last 3 Encounters:  07/25/19 124/86  04/04/19 (!) 148/85  11/22/18 137/81    [place] Has several painful hardened areas on  bottom of both feet. Likley plantar warts. No areas of skin breakdown.   Review of Systems  Constitutional: Negative for fever, malaise/fatigue and weight loss.  HENT: Negative.  Negative for nosebleeds.   Eyes: Negative.  Negative for blurred vision, double vision and photophobia.  Respiratory: Negative.  Negative for cough and shortness of breath.   Cardiovascular: Negative.  Negative for chest pain, palpitations and leg swelling.  Gastrointestinal: Negative.  Negative for heartburn, nausea and vomiting.  Musculoskeletal: Positive for back pain and myalgias.  Neurological: Negative.  Negative for dizziness, focal weakness, seizures and headaches.  Psychiatric/Behavioral: Negative.  Negative for suicidal ideas.    Past Medical History:  Diagnosis Date   Diabetes mellitus without complication ([place])    Hyperlipidemia    Hypertension     Past Surgical History:  Procedure Laterality Date   ANKLE SURGERY  2004   CESAREAN SECTION     x2    Family History  Problem Relation Age of Onset   Diabetes Mother     Social History Reviewed with no changes to be made today.   Outpatient Medications Prior to Visit  Medication Sig Dispense Refill   Blood Glucose Monitoring Suppl (CONTOUR NEXT MONITOR) w/Device KIT 1 kit by Does not apply route daily. 1 kit 0   Vitamin D, Ergocalciferol, (DRISDOL) 1.25 MG (50000 UNIT) CAPS capsule Take 1 capsule (50,000 Units total) by mouth every 7 (seven) days. 12 capsule 0   glimepiride (AMARYL) 4 MG tablet TAKE 1 TABLET BY MOUTH ONCE A DAY BEFORE BREAKFAST. Please fill as a 90 day supply 90 tablet 2   glucose blood (CONTOUR NEXT TEST) test strip Use as instructed. Check blood glucose level by fingerstick twice per day. E11.65 100 each 12   Lancet Devices (MICROLET NEXT LANCING DEVICE) MISC 1 Device by Does not apply route daily. 1 each 0   Microlet Lancets MISC Use as instructed. Check blood glucose level by fingerstick twice per day. E11.65  100 each 12   acetaminophen (TYLENOL) 500 MG tablet Take 1 tablet (500 mg total) by mouth every 6 (six) hours as needed. (Patient not taking: Reported on 07/06/2017) 30 tablet 0   atorvastatin (LIPITOR) 40 MG tablet Take 1 tablet (40 mg total) by mouth daily. Please fill as a 90 day supply 90 tablet 0   cyclobenzaprine (FLEXERIL) 5 MG tablet TAKE 1 TABLET (5 MG TOTAL) BY MOUTH 3 (THREE) TIMES DAILY AS NEEDED FOR MUSCLE SPASMS. (Patient not taking: Reported on 07/25/2019) 60 tablet 1   ibuprofen (ADVIL) 600 MG tablet Take 1 tablet (600 mg total) by mouth every 8 (eight) hours as needed. (Patient not taking: Reported on 11/22/2018) 60 tablet 0   lisinopril (ZESTRIL) 10 MG tablet Take 1 tablet (10 mg total) by mouth daily. Please fill as  a 90 day supply 90 tablet 1   metFORMIN (GLUCOPHAGE) 1000 MG tablet Take 1 tablet (1,000 mg total) by mouth 2 (two) times daily with a meal. Please fill as a 90 day supply 180 tablet 1   naproxen (NAPROSYN) 500 MG tablet Take 1 tablet (500 mg total) by mouth 2 (two) times daily. (Patient not taking: Reported on 07/25/2019) 30 tablet 0   No facility-administered medications prior to visit.    No Known Allergies     Objective:    BP 124/86 (BP Location: Left Arm, Patient Position: Sitting, Cuff Size: Large)    Pulse 74    Temp 97.7 F (36.5 C) (Temporal)    Ht _0  (1.753 m)    Wt 246 lb (111.6 kg)    LMP  (LMP Unknown)    SpO2 99%    BMI 36.33 kg/m  Wt Readings from Last 3 Encounters:  07/25/19 246 lb (111.6 kg)  04/04/19 252 lb (114.3 kg)  11/22/18 256 lb 12.8 oz (116.5 kg)    Physical Exam Vitals and nursing note reviewed.  Constitutional:      Appearance: She is well-developed.  HENT:     Head: Normocephalic and atraumatic.  Cardiovascular:     Rate and Rhythm: Normal rate and regular rhythm.     Pulses:          Dorsalis pedis pulses are 2+ on the right side and 2+ on the left side.       Posterior tibial pulses are 2+ on the right side and 2+  on the left side.     Heart sounds: Normal heart sounds. No murmur. No friction rub. No gallop.   Pulmonary:     Effort: Pulmonary effort is normal. No tachypnea or respiratory distress.     Breath sounds: Normal breath sounds. No decreased breath sounds, wheezing, rhonchi or rales.  Chest:     Chest wall: No tenderness.  Abdominal:     General: Bowel sounds are normal.     Palpations: Abdomen is soft.  Musculoskeletal:     Cervical back: Normal range of motion.     Right lower leg: Swelling present.     Left lower leg: Swelling present.     Right ankle: Swelling present.     Left ankle: Swelling present.     Right foot: Normal range of motion. Swelling present.     Left foot: Normal range of motion. Swelling present.       Feet:  Feet:     Right foot:     Skin integrity: Callus present. No ulcer, blister, skin breakdown or erythema.     Left foot:     Skin integrity: Callus present. No ulcer, blister, skin breakdown or erythema.  Skin:    General: Skin is warm and dry.  Neurological:     Mental Status: She is alert and oriented to person, place, and time.     Coordination: Coordination normal.  Psychiatric:        Behavior: Behavior normal. Behavior is cooperative.        Thought Content: Thought content normal.        Judgment: Judgment normal.          Patient has been counseled extensively about nutrition and exercise as well as the importance of adherence with medications and regular follow-up. The patient was given clear instructions to go to ER or return to medical center if symptoms don't improve, worsen or new problems develop. The  patient verbalized understanding.   Follow-up: Return in about 3 months (around 10/25/2019).   Gildardo Pounds, FNP-BC [facility] and [facility] [place], [place]   07/30/2019, 1:44 PM

## 2019-07-26 LAB — BASIC METABOLIC PANEL
BUN/Creatinine Ratio: 22 (ref 9–23)
BUN: 16 mg/dL (ref 6–24)
CO2: 25 mmol/L (ref 20–29)
Calcium: 9.7 mg/dL (ref 8.7–10.2)
Chloride: 101 mmol/L (ref 96–106)
Creatinine, Ser: 0.74 mg/dL (ref 0.57–1.00)
GFR calc Af Amer: 103 mL/min/{1.73_m2} (ref >59)
GFR calc non Af Amer: 89 mL/min/{1.73_m2} (ref >59)
Glucose: 146 mg/dL — ABNORMAL HIGH (ref 65–99)
Potassium: 4.5 mmol/L (ref 3.5–5.2)
Sodium: 140 mmol/L (ref 134–144)

## 2019-07-26 MED FILL — FUROSEMIDE 20 MG TABS: 20 | 30 days supply | Qty: 30 | Fill #0

## 2019-07-30 ENCOUNTER — Encounter: Payer: Self-pay | Admitting: Nurse Practitioner

## 2019-08-08 MED FILL — GLIMEPIRIDE 4 MG TABS: 4 | 30 days supply | Qty: 30 | Fill #0

## 2019-09-04 MED FILL — metFORMIN HCL 1000 MG TABS: 1000 | 90 days supply | Qty: 180 | Fill #1

## 2019-09-04 MED FILL — GLIMEPIRIDE 4 MG TABS: 4 | 30 days supply | Qty: 30 | Fill #1

## 2019-10-10 MED FILL — GLIMEPIRIDE 4 MG TABS: 4 | 30 days supply | Qty: 30 | Fill #2

## 2019-10-25 ENCOUNTER — Other Ambulatory Visit: Payer: Self-pay | Admitting: Nurse Practitioner

## 2019-10-25 ENCOUNTER — Ambulatory Visit: Payer: PRIVATE HEALTH INSURANCE | Attending: Nurse Practitioner | Admitting: Nurse Practitioner

## 2019-10-25 ENCOUNTER — Encounter: Payer: Self-pay | Admitting: Nurse Practitioner

## 2019-10-25 ENCOUNTER — Other Ambulatory Visit: Payer: Self-pay

## 2019-10-25 DIAGNOSIS — E782 Mixed hyperlipidemia: Secondary | ICD-10-CM

## 2019-10-25 DIAGNOSIS — I10 Essential (primary) hypertension: Secondary | ICD-10-CM | POA: Diagnosis not present

## 2019-10-25 DIAGNOSIS — E1169 Type 2 diabetes mellitus with other specified complication: Secondary | ICD-10-CM | POA: Diagnosis not present

## 2019-10-25 DIAGNOSIS — E1165 Type 2 diabetes mellitus with hyperglycemia: Secondary | ICD-10-CM

## 2019-10-25 MED ORDER — ATORVASTATIN CALCIUM 40 MG PO TABS: 40.0000 mg | ORAL_TABLET | Freq: Every day | ORAL | 1 refills | Status: DC

## 2019-10-25 MED ORDER — LISINOPRIL 5 MG PO TABS: 5.0000 mg | ORAL_TABLET | Freq: Every day | ORAL | 1 refills | Status: DC

## 2019-10-25 MED ORDER — METFORMIN HCL 1000 MG PO TABS: 1000.0000 mg | ORAL_TABLET | Freq: Two times a day (BID) | ORAL | 1 refills | Status: DC

## 2019-10-25 MED ORDER — GLIMEPIRIDE 4 MG PO TABS: ORAL_TABLET | ORAL | 2 refills | Status: DC

## 2019-10-25 NOTE — Progress Notes (Signed)
Virtual Visit via Telephone Note Due to national recommendations of social distancing due to COVID 19, telehealth visit is felt to be most appropriate for this patient at this time.  I discussed the limitations, risks, security and privacy concerns of performing an evaluation and management service by telephone and the availability of in person appointments. I also discussed with the patient that there may be a patient responsible charge related to this service. The patient expressed understanding and agreed to proceed.    I connected with Christina Fields MO Haller on 10/25/19  at   4:10 PM EDT  EDT by telephone and verified that I am speaking with the correct person using two identifiers.   Consent I discussed the limitations, risks, security and privacy concerns of performing an evaluation and management service by telephone and the availability of in person appointments. I also discussed with the patient that there may be a patient responsible charge related to this service. The patient expressed understanding and agreed to proceed.   Location of Patient: Private Residence   Location of Provider: [facility]    Persons participating in Telemedicine visit: Christina Denver FNP-BC YY [place] CMA Christina Fields MO Christina Fields  Arabic Interpreter ID# [id number]   History of Present Illness: Telemedicine visit for: F/U  DM TYPE 2 She has not monitored her blood glucose levels in several days. States readings are usually good so she does not check it every day. She did a fingerstick while we were on the tele visit. Reading was 133. She denies any symptoms of hyperglycemia or hypoglycemia. She is currently taking Amaryl 4 mg daily and Metformin 1000 mg BID. LDL is not at goal. Taking atorvastatin 40 mg daily. Denies statin intolerance.  Lab Results  Component Value Date   HGBA1C 7.4 (A) 07/25/2019   Lab Results  Component Value Date   LDLCALC 104 (H)  11/22/2018    Essential Hypertension Well controlled with amlodipine 5 mg daily. She does experience significant intermittent BLE swelling. I had ordered her furosemide for this however she states she never took it because she was afraid to. I have instructed her to take it sparingly. Denies chest pain, shortness of breath, palpitations, lightheadedness, dizziness, headaches or BLE edema.  BP Readings from Last 3 Encounters:  07/25/19 124/86  04/04/19 (!) 148/85  11/22/18 137/81     Past Medical History:  Diagnosis Date   Diabetes mellitus without complication (HCC)    Hyperlipidemia    Hypertension     Past Surgical History:  Procedure Laterality Date   ANKLE SURGERY  2004   CESAREAN SECTION     x2    Family History  Problem Relation Age of Onset   Diabetes Mother     Social History   Socioeconomic History   Marital status: Married    Spouse name: Not on file   Number of children: Not on file   Years of education: Not on file   Highest education level: Not on file  Occupational History   Not on file  Tobacco Use   Smoking status: Never Smoker   Smokeless tobacco: Never Used  Vaping Use   Vaping Use: Never used  Substance and Sexual Activity   Alcohol use: No   Drug use: No   Sexual activity: Not on file  Other Topics Concern   Not on file  Social History Narrative   Not on file   Social Determinants of Health   Financial Resource Strain:  Difficulty of Paying Living Expenses:   Food Insecurity:    Worried About Programme researcher, broadcasting/film/video in the Last Year:    Barista in the Last Year:   Transportation Needs:    Freight forwarder (Medical):    Lack of Transportation (Non-Medical):   Physical Activity:    Days of Exercise per Week:    Minutes of Exercise per Session:   Stress:    Feeling of Stress :   Social Connections:    Frequency of Communication with Friends and Family:    Frequency of Social Gatherings with  Friends and Family:    Attends Religious Services:    Active Member of Clubs or Organizations:    Attends Engineer, structural:    Marital Status:      Observations/Objective: Awake, alert and oriented x 3   Review of Systems  Constitutional: Negative for fever, malaise/fatigue and weight loss.  HENT: Negative.  Negative for nosebleeds.   Eyes: Negative.  Negative for blurred vision, double vision and photophobia.  Respiratory: Negative.  Negative for cough and shortness of breath.   Cardiovascular: Negative.  Negative for chest pain, palpitations and leg swelling.  Gastrointestinal: Negative.  Negative for heartburn, nausea and vomiting.  Musculoskeletal: Negative.  Negative for myalgias.  Neurological: Negative.  Negative for dizziness, focal weakness, seizures and headaches.  Psychiatric/Behavioral: Negative.  Negative for suicidal ideas.    Assessment and Plan: Christina Fields was seen today for follow-up.  Diagnoses and all orders for this visit:  Type 2 diabetes mellitus with hyperglycemia, without long-term current use of insulin (HCC) -     metFORMIN (GLUCOPHAGE) 1000 MG tablet; Take 1 tablet (1,000 mg total) by mouth 2 (two) times daily with a meal. Please fill as a 90 day supply -     glimepiride (AMARYL) 4 MG tablet; TAKE 1 TABLET BY MOUTH ONCE A DAY BEFORE BREAKFAST. Please fill as a 90 day supply Continue blood sugar control as discussed in office today, low carbohydrate diet, and regular physical exercise as tolerated, 150 minutes per week (30 min each day, 5 days per week, or 50 min 3 days per week). Keep blood sugar logs with fasting goal of 90-130 mg/dl, post prandial (after you eat) less than 180.  For Hypoglycemia: BS <60 and Hyperglycemia BS >400; contact the clinic ASAP. Annual eye exams and foot exams are recommended.   Essential hypertension -     lisinopril (ZESTRIL) 5 MG tablet; Take 1 tablet (5 mg total) by mouth daily. Please fill as a 90 day  supply Continue all antihypertensives as prescribed.  Remember to bring in your blood pressure log with you for your follow up appointment.  DASH/Mediterranean Diets are healthier choices for HTN.    Combined hyperlipidemia associated with type 2 diabetes mellitus (HCC) -     atorvastatin (LIPITOR) 40 MG tablet; Take 1 tablet (40 mg total) by mouth daily. Please fill as a 90 day supply INSTRUCTIONS: Work on a low fat, heart healthy diet and participate in regular aerobic exercise program by working out at least 150 minutes per week; 5 days a week-30 minutes per day. Avoid red meat/beef/steak,  fried foods. junk foods, sodas, sugary drinks, unhealthy snacking, alcohol and smoking.  Drink at least 80 oz of water per day and monitor your carbohydrate intake daily.     Follow Up Instructions Return in about 3 months (around 01/25/2020).     I discussed the assessment and treatment plan with the  patient. The patient was provided an opportunity to ask questions and all were answered. The patient agreed with the plan and demonstrated an understanding of the instructions.   The patient was advised to call back or seek an in-person evaluation if the symptoms worsen or if the condition fails to improve as anticipated.  I provided 17 minutes of non-face-to-face time during this encounter including median intraservice time, reviewing previous notes, labs, imaging, medications and explaining diagnosis and management.  Claiborne Rigg, FNP-BC

## 2019-10-27 ENCOUNTER — Other Ambulatory Visit: Payer: Self-pay

## 2019-10-27 ENCOUNTER — Ambulatory Visit: Payer: PRIVATE HEALTH INSURANCE | Attending: Nurse Practitioner

## 2019-10-27 ENCOUNTER — Other Ambulatory Visit: Payer: Self-pay | Admitting: Nurse Practitioner

## 2019-10-27 DIAGNOSIS — E559 Vitamin D deficiency, unspecified: Secondary | ICD-10-CM

## 2019-10-27 DIAGNOSIS — E119 Type 2 diabetes mellitus without complications: Secondary | ICD-10-CM

## 2019-10-27 DIAGNOSIS — I10 Essential (primary) hypertension: Secondary | ICD-10-CM

## 2019-10-27 DIAGNOSIS — E785 Hyperlipidemia, unspecified: Secondary | ICD-10-CM

## 2019-10-27 MED FILL — ATORVASTATIN CALCIUM 40 MG: 40 | 30 days supply | Qty: 30 | Fill #0

## 2019-10-27 MED FILL — LISINOPRIL 5 MG TABLET: 5 | 30 days supply | Qty: 30 | Fill #0

## 2019-10-28 LAB — LIPID PANEL
Chol/HDL Ratio: 3.8 ratio (ref 0.0–4.4)
Cholesterol, Total: 174 mg/dL (ref 100–199)
HDL: 46 mg/dL (ref >39)
LDL Chol Calc (NIH): 88 mg/dL (ref 0–99)
Triglycerides: 237 mg/dL — ABNORMAL HIGH (ref 0–149)
VLDL Cholesterol Cal: 40 mg/dL (ref 5–40)

## 2019-10-28 LAB — CMP14+EGFR
ALT: 12 IU/L (ref 0–32)
AST: 9 IU/L (ref 0–40)
Albumin/Globulin Ratio: 1.6 (ref 1.2–2.2)
Albumin: 4.1 g/dL (ref 3.8–4.9)
Alkaline Phosphatase: 74 IU/L (ref 48–121)
BUN/Creatinine Ratio: 18 (ref 9–23)
BUN: 16 mg/dL (ref 6–24)
Bilirubin Total: <0.2 mg/dL (ref 0.0–1.2)
CO2: 24 mmol/L (ref 20–29)
Calcium: 9.1 mg/dL (ref 8.7–10.2)
Chloride: 103 mmol/L (ref 96–106)
Creatinine, Ser: 0.87 mg/dL (ref 0.57–1.00)
GFR calc Af Amer: 84 mL/min/{1.73_m2} (ref >59)
GFR calc non Af Amer: 73 mL/min/{1.73_m2} (ref >59)
Globulin, Total: 2.5 g/dL (ref 1.5–4.5)
Glucose: 230 mg/dL — ABNORMAL HIGH (ref 65–99)
Potassium: 4.6 mmol/L (ref 3.5–5.2)
Sodium: 139 mmol/L (ref 134–144)
Total Protein: 6.6 g/dL (ref 6.0–8.5)

## 2019-10-28 LAB — VITAMIN D 25 HYDROXY (VIT D DEFICIENCY, FRACTURES): Vit D, 25-Hydroxy: 21.9 ng/mL — ABNORMAL LOW (ref 30.0–100.0)

## 2019-10-28 LAB — HEMOGLOBIN A1C
Est. average glucose Bld gHb Est-mCnc: 206 mg/dL
Hgb A1c MFr Bld: 8.8 % — ABNORMAL HIGH (ref 4.8–5.6)

## 2019-10-29 ENCOUNTER — Other Ambulatory Visit: Payer: Self-pay | Admitting: Nurse Practitioner

## 2019-10-29 MED ORDER — VICTOZA 18 MG/3ML ~~LOC~~ SOPN: 1.8000 mg | PEN_INJECTOR | Freq: Every day | SUBCUTANEOUS | 1 refills | Status: DC

## 2019-10-29 MED ORDER — BD PEN NEEDLE MINI U/F 31G X 5 MM MISC: 6 refills | Status: DC

## 2019-10-30 MED FILL — TRUEPLUS 5-BEVEL PEN NEEDLE: 31G X 5 MM | 90 days supply | Qty: 100 | Fill #0

## 2019-11-01 MED FILL — !VICTOZA 18MG/3ML INJECT: 18 | 30 days supply | Qty: 9 | Fill #0

## 2019-11-06 MED FILL — GLIMEPIRIDE 4 MG TABS: 4 | 30 days supply | Qty: 30 | Fill #3

## 2019-12-07 MED FILL — METFORMIN HCL 1000 MG TABS: 1000 | 90 days supply | Qty: 180 | Fill #0

## 2019-12-07 MED FILL — GLIMEPIRIDE 4 MG TABS: 4 | 30 days supply | Qty: 30 | Fill #4

## 2019-12-11 MED FILL — LISINOPRIL 5 MG TABLET: 5 | 30 days supply | Qty: 30 | Fill #1

## 2020-01-08 MED FILL — LISINOPRIL 5 MG TABLET: 5 | 30 days supply | Qty: 30 | Fill #2

## 2020-01-08 MED FILL — GLIMEPIRIDE 4 MG TABS: 4 | 30 days supply | Qty: 30 | Fill #5

## 2020-01-26 ENCOUNTER — Ambulatory Visit: Payer: PRIVATE HEALTH INSURANCE | Attending: Nurse Practitioner | Admitting: Nurse Practitioner

## 2020-01-26 ENCOUNTER — Other Ambulatory Visit: Payer: Self-pay

## 2020-01-26 ENCOUNTER — Other Ambulatory Visit: Payer: Self-pay | Admitting: Pharmacist

## 2020-01-26 ENCOUNTER — Encounter: Payer: Self-pay | Admitting: Nurse Practitioner

## 2020-01-26 VITALS — BP 145/96 | HR 77 | Temp 97.6°F | Ht 69.0 in | Wt 251.0 lb

## 2020-01-26 DIAGNOSIS — E1165 Type 2 diabetes mellitus with hyperglycemia: Secondary | ICD-10-CM

## 2020-01-26 DIAGNOSIS — E119 Type 2 diabetes mellitus without complications: Secondary | ICD-10-CM

## 2020-01-26 DIAGNOSIS — E1169 Type 2 diabetes mellitus with other specified complication: Secondary | ICD-10-CM

## 2020-01-26 DIAGNOSIS — E782 Mixed hyperlipidemia: Secondary | ICD-10-CM

## 2020-01-26 DIAGNOSIS — I10 Essential (primary) hypertension: Secondary | ICD-10-CM

## 2020-01-26 DIAGNOSIS — B070 Plantar wart: Secondary | ICD-10-CM

## 2020-01-26 LAB — POCT GLYCOSYLATED HEMOGLOBIN (HGB A1C): Hemoglobin A1C: 8.8 % — AB (ref 4.0–5.6)

## 2020-01-26 LAB — GLUCOSE, POCT (MANUAL RESULT ENTRY): POC Glucose: 149 mg/dl — AB (ref 70–99)

## 2020-01-26 MED ORDER — ATORVASTATIN CALCIUM 40 MG PO TABS: 40.0000 mg | ORAL_TABLET | Freq: Every day | ORAL | 1 refills | Status: DC

## 2020-01-26 MED ORDER — TRUEPLUS LANCETS 28G MISC: 2 refills | Status: DC

## 2020-01-26 MED ORDER — EMPAGLIFLOZIN 10 MG PO TABS: 10.0000 mg | ORAL_TABLET | Freq: Every day | ORAL | 3 refills | Status: DC

## 2020-01-26 MED ORDER — TRUE METRIX BLOOD GLUCOSE TEST VI STRP: ORAL_STRIP | 2 refills | Status: DC

## 2020-01-26 MED ORDER — LISINOPRIL 5 MG PO TABS: 5.0000 mg | ORAL_TABLET | Freq: Every day | ORAL | 1 refills | Status: DC

## 2020-01-26 MED ORDER — METFORMIN HCL 1000 MG PO TABS: 1000.0000 mg | ORAL_TABLET | Freq: Two times a day (BID) | ORAL | 1 refills | Status: DC

## 2020-01-26 MED ORDER — GLIMEPIRIDE 4 MG PO TABS: 8.0000 mg | ORAL_TABLET | Freq: Every day | ORAL | 0 refills | Status: DC

## 2020-01-26 MED ORDER — TRUE METRIX METER W/DEVICE KIT: PACK | 0 refills | Status: DC

## 2020-01-26 MED FILL — ATORVASTATIN CALCIUM 40 MG: 40 | 90 days supply | Qty: 90 | Fill #0

## 2020-01-26 MED FILL — GLIMEPIRIDE 4 MG TABS: 4 | 90 days supply | Qty: 180 | Fill #0

## 2020-01-26 MED FILL — TRUEplus LANCETS 28G MISC: 50 days supply | Qty: 100 | Fill #0

## 2020-01-26 MED FILL — !TRUE METRIX BLOOD GLUCOSE: 1 days supply | Qty: 1 | Fill #0

## 2020-01-26 MED FILL — TRUE METRIX TEST STRIP: 50 days supply | Qty: 100 | Fill #0

## 2020-01-26 MED FILL — JARDIANCE 10 MG TABLET: 10 | 30 days supply | Qty: 30 | Fill #0

## 2020-01-26 NOTE — Patient Instructions (Addendum)
[facility]) Address: [address] [place], [place], [place] [address] Phone: [phone]    [facility] Address: [address] [place], [place], [place] [address] Phone: [phone]

## 2020-01-26 NOTE — Progress Notes (Signed)
Assessment & Plan:  Christina Fields was seen today for follow-up.  Diagnoses and all orders for this visit:  Type 2 diabetes mellitus with hyperglycemia, without long-term current use of insulin (HCC) -     Glucose (CBG) -     HgB A1c -     Ambulatory referral to Ophthalmology -     glimepiride (AMARYL) 4 MG tablet; Take 2 tablets (8 mg total) by mouth daily with breakfast. Please fill as a 90 day supply -     metFORMIN (GLUCOPHAGE) 1000 MG tablet; Take 1 tablet (1,000 mg total) by mouth 2 (two) times daily with a meal. Please fill as a 90 day supply -     empagliflozin (JARDIANCE) 10 MG TABS tablet; Take 1 tablet (10 mg total) by mouth daily before breakfast.  Plantar wart of right foot -     Ambulatory referral to Podiatry  Combined hyperlipidemia associated with type 2 diabetes mellitus (HCC) -     atorvastatin (LIPITOR) 40 MG tablet; Take 1 tablet (40 mg total) by mouth daily. Please fill as a 90 day supply  Essential hypertension -     Cancel: Basic metabolic panel -     lisinopril (ZESTRIL) 5 MG tablet; Take 1 tablet (5 mg total) by mouth daily. Please fill as a 90 day supply -     Basic metabolic panel    Patient has been counseled on age-appropriate routine health concerns for screening and prevention. These are reviewed and up-to-date. Referrals have been placed accordingly. Immunizations are up-to-date or declined.    Subjective:   Chief Complaint  Patient presents with  . Follow-up    Pt. is here for 3 months F.U.    HPI Christina Fields 59 y.o. female presents to office  today for follow up. She never heard from the podiatrist or the eye doctor. She was given both office numbers today to contact.  PMH: DM2, HTN, HPL.   VRI was used to communicate directly with patient for the entire encounter including providing detailed patient instructions.    She has a heart murmur on exam today. Will schedule for EKG.   DM 2 Not well controlled. She does not want to  take victoza. Or any injectable or insulin which was offered today. She was prescribed victoza at her last office visit however today she states she never picked it up.  Increasing glimepiride today from 4 mg to 8 mg. She will continue on metformin 1000 mg BID and I am adding jardiance 10 mg. LDL not at goal with atorvastatin 40 mg. She endorses medication adherence. Likely related to diet and lack of exercise/activity. Blood pressure is well controlled with lisinopril 5 mg daily.  Denies chest pain, shortness of breath, palpitations, lightheadedness, dizziness, headaches or BLE edema.  Lab Results  Component Value Date   HGBA1C 8.8 (A) 01/26/2020   Lab Results  Component Value Date   LDLCALC 88 10/27/2019   BP Readings from Last 3 Encounters:  01/26/20 (!) 145/96  07/25/19 124/86  04/04/19 (!) 148/85   Review of Systems  Constitutional: Negative for fever, malaise/fatigue and weight loss.  HENT: Negative.  Negative for nosebleeds.   Eyes: Negative.  Negative for blurred vision, double vision and photophobia.  Respiratory: Negative.  Negative for cough and shortness of breath.   Cardiovascular: Negative.  Negative for chest pain, palpitations and leg swelling.  Gastrointestinal: Negative.  Negative for heartburn, nausea and vomiting.  Musculoskeletal: Negative.  Negative for myalgias.  Neurological: Negative.  Negative for dizziness, focal weakness, seizures and headaches.  Psychiatric/Behavioral: Negative.  Negative for suicidal ideas.    Past Medical History:  Diagnosis Date  . Diabetes mellitus without complication ([place])   . Hyperlipidemia   . Hypertension     Past Surgical History:  Procedure Laterality Date  . ANKLE SURGERY  2004  . CESAREAN SECTION     x2    Family History  Problem Relation Age of Onset  . Diabetes Mother     Social History Reviewed with no changes to be made today.   Outpatient Medications Prior to Visit  Medication Sig Dispense Refill  .  cyclobenzaprine (FLEXERIL) 5 MG tablet Take 1 tablet (5 mg total) by mouth 3 (three) times daily as needed for muscle spasms. 60 tablet 1  . Insulin Pen Needle (B-D UF III MINI PEN NEEDLES) 31G X 5 MM MISC Use as instructed. Inject into the skin once daily 100 each 6  . Lancet Devices (MICROLET NEXT LANCING DEVICE) MISC 1 Device by Does not apply route daily. 1 each 0  . Vitamin D, Ergocalciferol, (DRISDOL) 1.25 MG (50000 UNIT) CAPS capsule Take 1 capsule (50,000 Units total) by mouth every 7 (seven) days. 12 capsule 0  . Blood Glucose Monitoring Suppl (CONTOUR NEXT MONITOR) w/Device KIT 1 kit by Does not apply route daily. 1 kit 0  . glimepiride (AMARYL) 4 MG tablet TAKE 1 TABLET BY MOUTH ONCE A DAY BEFORE BREAKFAST. Please fill as a 90 day supply 90 tablet 2  . glucose blood (CONTOUR NEXT TEST) test strip Use as instructed. Check blood glucose level by fingerstick twice per day. E11.65 100 each 12  . liraglutide (VICTOZA) 18 MG/3ML SOPN Inject 0.3 mLs (1.8 mg total) into the skin daily. 30 mL 1  . Microlet Lancets MISC Use as instructed. Check blood glucose level by fingerstick twice per day. E11.65 100 each 12  . furosemide (LASIX) 20 MG tablet Take 1 tablet (20 mg total) by mouth daily as needed for edema (leg swelling). Only take as needed for leg swelling 30 tablet 3  . atorvastatin (LIPITOR) 40 MG tablet Take 1 tablet (40 mg total) by mouth daily. Please fill as a 90 day supply 90 tablet 1  . lisinopril (ZESTRIL) 5 MG tablet Take 1 tablet (5 mg total) by mouth daily. Please fill as a 90 day supply 90 tablet 1  . metFORMIN (GLUCOPHAGE) 1000 MG tablet Take 1 tablet (1,000 mg total) by mouth 2 (two) times daily with a meal. Please fill as a 90 day supply 180 tablet 1   No facility-administered medications prior to visit.    No Known Allergies     Objective:    BP (!) 145/96 (BP Location: Left Arm, Patient Position: Sitting, Cuff Size: Large)   Pulse 77   Temp 97.6 F (36.4 C) (Temporal)    Ht $R'5\' 9"'NR$  (1.753 m)   Wt 251 lb (113.9 kg)   LMP  (LMP Unknown)   SpO2 97%   BMI 37.07 kg/m  Wt Readings from Last 3 Encounters:  01/26/20 251 lb (113.9 kg)  07/25/19 246 lb (111.6 kg)  04/04/19 252 lb (114.3 kg)    Physical Exam Vitals and nursing note reviewed.  Constitutional:      Appearance: She is well-developed.  HENT:     Head: Normocephalic and atraumatic.  Cardiovascular:     Rate and Rhythm: Normal rate and regular rhythm.     Heart sounds: Murmur heard.  No friction rub. No gallop.   Pulmonary:     Effort: Pulmonary effort is normal. No tachypnea or respiratory distress.     Breath sounds: Normal breath sounds. No decreased breath sounds, wheezing, rhonchi or rales.  Chest:     Chest wall: No tenderness.  Abdominal:     General: Bowel sounds are normal.     Palpations: Abdomen is soft.  Musculoskeletal:        General: Normal range of motion.     Cervical back: Normal range of motion.  Skin:    General: Skin is warm and dry.  Neurological:     Mental Status: She is alert and oriented to person, place, and time.     Coordination: Coordination normal.  Psychiatric:        Behavior: Behavior normal. Behavior is cooperative.        Thought Content: Thought content normal.        Judgment: Judgment normal.          Patient has been counseled extensively about nutrition and exercise as well as the importance of adherence with medications and regular follow-up. The patient was given clear instructions to go to ER or return to medical center if symptoms don't improve, worsen or new problems develop. The patient verbalized understanding.   Follow-up: Return in about 4 weeks (around 02/23/2020) for [place] and EKG with NURSE .   Gildardo Pounds, FNP-BC [facility] [place], [place]   01/27/2020, 11:32 AM

## 2020-01-27 ENCOUNTER — Encounter: Payer: Self-pay | Admitting: Nurse Practitioner

## 2020-02-16 ENCOUNTER — Ambulatory Visit: Payer: PRIVATE HEALTH INSURANCE | Admitting: Pharmacist

## 2020-02-20 ENCOUNTER — Encounter: Payer: Self-pay | Admitting: Pharmacist

## 2020-02-20 ENCOUNTER — Ambulatory Visit: Payer: PRIVATE HEALTH INSURANCE | Attending: Nurse Practitioner | Admitting: Pharmacist

## 2020-02-20 ENCOUNTER — Other Ambulatory Visit: Payer: Self-pay | Admitting: Family Medicine

## 2020-02-20 ENCOUNTER — Other Ambulatory Visit: Payer: Self-pay

## 2020-02-20 DIAGNOSIS — E1165 Type 2 diabetes mellitus with hyperglycemia: Secondary | ICD-10-CM | POA: Diagnosis not present

## 2020-02-20 LAB — GLUCOSE, POCT (MANUAL RESULT ENTRY): POC Glucose: 162 mg/dl — AB (ref 70–99)

## 2020-02-20 MED ORDER — JANUMET 50-1000 MG PO TABS: 1.0000 | ORAL_TABLET | Freq: Two times a day (BID) | ORAL | 2 refills | Status: DC

## 2020-02-20 MED FILL — !JANUMET 50-1000MG TABLET: 50-1000 | 30 days supply | Qty: 60 | Fill #0

## 2020-02-20 MED FILL — LISINOPRIL 5 MG TABLET: 5 | 30 days supply | Qty: 30 | Fill #3

## 2020-02-20 MED FILL — JARDIANCE 10 MG TABLET: 10 | 30 days supply | Qty: 30 | Fill #1

## 2020-02-20 NOTE — Progress Notes (Signed)
    S:    PCP: Zelda  No chief complaint on file.  Patient arrives well and in good spirits.  Presents for diabetes evaluation, education, and management. Patient was referred and last seen by Primary Care Provider, Bertram Denver, on 01/26/20. At this visit, glimepiride was increased to 4 mg twice daily and Jardiance was started.   Today, patient reports appropriate adherence to medications. Patient denies hypoglycemic events.   Patient reports Diabetes was diagnosed in 2018  Family/Social History: diabetes (mother)   Insurance coverage/medication affordability: self pay  Current diabetes medications include: glimepiride 4 mg BID, metformin 1000 mg BID, and jardiance 10 mg daily  Current hypertension medications include: lisinopril 5 mg daily  Current hyperlipidemia medications include: atorvastatin 40 mg   Patient denies hypoglycemic events.  Patient reported dietary habits: Eats 3 meals/day Reports that dieting and exercise has been a struggle. Reports to trying to limit salt and sugar in foods. Denies exercise at this time, but plans to start walking at least 20 minutes a day.    Patient denies nocturia (nighttime urination).  Patient denies neuropathy (nerve pain). Patient denies visual changes. Patient reports self foot exams.     O:  Physical Exam   ROS  Blood sugar in office today: 162   Lab Results  Component Value Date   HGBA1C 8.8 (A) 01/26/2020   There were no vitals filed for this visit.  Lipid Panel     Component Value Date/Time   CHOL 174 10/27/2019 1600   TRIG 237 (H) 10/27/2019 1600   HDL 46 10/27/2019 1600   CHOLHDL 3.8 10/27/2019 1600   LDLCALC 88 10/27/2019 1600    Home fasting blood sugars:   7 day average: 199  14 day average: 185  30 day average: 178   Clinical Atherosclerotic Cardiovascular Disease (ASCVD): No  The ASCVD Risk score Denman George DC Jr., et al., 2013) failed to calculate for the following reasons:   Unable to determine  if patient is Non-Hispanic African American     A/P: Diabetes longstanding, currently uncontrolled. Patient is able to verbalize appropriate hypoglycemia management plan. Medication adherence appears appropriate. Patient remains hyperglycemic despite recent medication increases and additions. She continues to deny injectable medications, leaving limited options for further control. Will initiate a DPP-4 inh today. Will utilize the combination medication of Janumet (sitagliptin and metformin) to eliminate pill burden.  -Started Janumet 50-1000 mg BID -Stopped metformin -Continued glimepiride 4 mg BID and jardiance 10 mg daily  -Extensively discussed pathophysiology of diabetes, recommended lifestyle interventions, dietary effects on blood sugar control -Counseled on s/sx of and management of hypoglycemia -Next A1C anticipated February 2022.   ASCVD risk - primary prevention in patient with diabetes. Last LDL is controlled. ASCVD risk score is not >20%  - moderate intensity statin indicated. Aspirin is not indicated.    -Continued atorvastatin 40 mg.   Hypertension longstanding currently controlled.  Blood pressure goal = 130/80 mmHg. Medication adherence appropriate.  -lisinopril 5 mg daily   Written patient instructions provided.  Total time in face to face counseling 15 minutes.   Follow up Pharmacist Clinic Visit in one month.    Theodis Sato, PharmD PGY-1 [facility] Resident   02/20/2020 4:23 PM

## 2020-02-23 ENCOUNTER — Ambulatory Visit (INDEPENDENT_AMBULATORY_CARE_PROVIDER_SITE_OTHER): Payer: PRIVATE HEALTH INSURANCE | Admitting: Podiatry

## 2020-02-23 ENCOUNTER — Other Ambulatory Visit: Payer: Self-pay

## 2020-02-23 DIAGNOSIS — Q828 Other specified congenital malformations of skin: Secondary | ICD-10-CM

## 2020-02-23 DIAGNOSIS — M79674 Pain in right toe(s): Secondary | ICD-10-CM | POA: Diagnosis not present

## 2020-02-23 DIAGNOSIS — E119 Type 2 diabetes mellitus without complications: Secondary | ICD-10-CM

## 2020-02-23 DIAGNOSIS — M79675 Pain in left toe(s): Secondary | ICD-10-CM | POA: Diagnosis not present

## 2020-02-23 DIAGNOSIS — B351 Tinea unguium: Secondary | ICD-10-CM

## 2020-02-27 ENCOUNTER — Encounter: Payer: Self-pay | Admitting: Podiatry

## 2020-02-27 NOTE — Progress Notes (Signed)
  Subjective:  Patient ID: Christina Fields, female    DOB: 03/07/1961,  MRN: [id number]  Chief Complaint  Patient presents with  . Plantar Warts    Bilateral lesions to bottom of feet    59 y.o. female returns for the above complaint.  Patient presents with thickened elongated dystrophic toenails x10.  She would also like to have her nails debrided down as she is not able to do it herself.  She denies any other acute complaints.  Patient is a diabetic with last A1c of 8.8.  Patient states is painful to walk on it.  Patient has a right heel hyperkeratotic lesion that is very painful to growth has been on for 5 years there is discoloration present.  Painful when standing on it.  She would like to have that debrided down.  Objective:  There were no vitals filed for this visit. Podiatric Exam: Vascular: dorsalis pedis and posterior tibial pulses are palpable bilateral. Capillary return is immediate. Temperature gradient is WNL. Skin turgor WNL  Sensorium: Normal Semmes Weinstein monofilament test. Normal tactile sensation bilaterally. Nail Exam: Pt has thick disfigured discolored nails with subungual debris noted bilateral entire nail hallux through fifth toenails.  Pain on palpation to the nails. Ulcer Exam: There is no evidence of ulcer or pre-ulcerative changes or infection. Orthopedic Exam: Muscle tone and strength are WNL. No limitations in general ROM. No crepitus or effusions noted. HAV  B/L.  Hammer toes 2-5  B/L. Skin: No Porokeratosis. No infection or ulcers    Assessment & Plan:   1. Type 2 diabetes mellitus without complication, without long-term current use of insulin (HCC)   2. Porokeratosis   3. Pain due to onychomycosis of toenails of both feet     Patient was evaluated and treated and all questions answered.  Porokeratosis right heel -I explained patient the etiology of porokeratosis and various treatment options were discussed.  I believe patient will benefit  from aggressive debridement of the lesion down to healthy striated tissue.  Using chisel blade to handle the lesion was debrided down.  No pinpoint bleeding noted no underlying ulceration noted.  Onychomycosis with pain  -Nails palliatively debrided as below. -Educated on self-care  Procedure: Nail Debridement Rationale: pain  Type of Debridement: manual, sharp debridement. Instrumentation: Nail nipper, rotary burr. Number of Nails: 10  Procedures and Treatment: Consent by patient was obtained for treatment procedures. The patient understood the discussion of treatment and procedures well. All questions were answered thoroughly reviewed. Debridement of mycotic and hypertrophic toenails, 1 through 5 bilateral and clearing of subungual debris. No ulceration, no infection noted.  Return Visit-Office Procedure: Patient instructed to return to the office for a follow up visit 3 months for continued evaluation and treatment.  Nicholes Rough, DPM    No follow-ups on file.

## 2020-03-21 MED FILL — JANUMET 50-1,000 MG TABLET: 50-1000 | 30 days supply | Qty: 60 | Fill #1

## 2020-03-21 MED FILL — LISINOPRIL 5 MG TABLET: 5 | 30 days supply | Qty: 30 | Fill #5

## 2020-03-21 MED FILL — LISINOPRIL 5 MG TABLET: 5 | 30 days supply | Qty: 30 | Fill #4

## 2020-03-22 ENCOUNTER — Encounter: Payer: Self-pay | Admitting: Pharmacist

## 2020-03-22 ENCOUNTER — Other Ambulatory Visit: Payer: Self-pay

## 2020-03-22 ENCOUNTER — Ambulatory Visit: Payer: PRIVATE HEALTH INSURANCE | Attending: Nurse Practitioner | Admitting: Pharmacist

## 2020-03-22 DIAGNOSIS — E1165 Type 2 diabetes mellitus with hyperglycemia: Secondary | ICD-10-CM

## 2020-03-22 LAB — GLUCOSE, POCT (MANUAL RESULT ENTRY): POC Glucose: 167 mg/dl — AB (ref 70–99)

## 2020-03-22 MED FILL — JARDIANCE 10 MG TABLET: 10 | 30 days supply | Qty: 30 | Fill #2

## 2020-03-22 NOTE — Progress Notes (Signed)
    S:    PCP: Zelda  No chief complaint on file.  Patient arrives well and in good spirits.  Presents for diabetes evaluation, education, and management. Patient was referred and last seen by Primary Care Provider, Bertram Denver, on 01/26/20. We last saw her on 02/20/2020 and started Janumet.   Today, patient reports appropriate adherence to medications. Patient denies hypoglycemic events.   Patient reports Diabetes was diagnosed in 2018  Family/Social History: diabetes (mother)   Insurance coverage/medication affordability: self pay  Current diabetes medications include: glimepiride 4 mg BID, Janumet 50-1000 mg BID, and jardiance 10 mg daily  Current hypertension medications include: lisinopril 5 mg daily  Current hyperlipidemia medications include: atorvastatin 40 mg   Patient denies hypoglycemic events.  Patient reported dietary habits: Eats 3 meals/day Reports that dieting and exercise has been a struggle. Reports to trying to limit salt and sugar in foods. Denies exercise at this time, but plans to start walking at least 20 minutes a day.    Patient denies nocturia (nighttime urination).  Patient denies neuropathy (nerve pain). Patient denies visual changes. Patient reports self foot exams.    O:  Blood sugar in office today: 167  Lab Results  Component Value Date   HGBA1C 8.8 (A) 01/26/2020   There were no vitals filed for this visit.  Lipid Panel     Component Value Date/Time   CHOL 174 10/27/2019 1600   TRIG 237 (H) 10/27/2019 1600   HDL 46 10/27/2019 1600   CHOLHDL 3.8 10/27/2019 1600   LDLCALC 88 10/27/2019 1600    Home fasting blood sugars: range from 90 - 130s   Clinical Atherosclerotic Cardiovascular Disease (ASCVD): No  The ASCVD Risk score Denman George DC Jr., et al., 2013) failed to calculate for the following reasons:   Unable to determine if patient is Non-Hispanic African American     A/P: Diabetes longstanding currently uncontrolled, however,  home glycemic control is improving. Patient is able to verbalize appropriate hypoglycemia management plan. Medication adherence appears appropriate.  -Continued current regimen. -Extensively discussed pathophysiology of diabetes, recommended lifestyle interventions, dietary effects on blood sugar control -Counseled on s/sx of and management of hypoglycemia -Next A1C anticipated February 2022.   ASCVD risk - primary prevention in patient with diabetes. Last LDL is controlled. ASCVD risk score is not >20%  - moderate intensity statin indicated. Aspirin is not indicated.    -Continued atorvastatin 40 mg.   Hypertension longstanding currently controlled.  Blood pressure goal = 130/80 mmHg. Medication adherence appropriate.  -lisinopril 5 mg daily   Written patient instructions provided.  Total time in face to face counseling 15 minutes.   Follow up PCP Clinic Visit next month.    Butch Penny, PharmD, Patsy [place], CPP Clinical Pharmacist [facility] [phone]

## 2020-04-16 ENCOUNTER — Ambulatory Visit: Payer: Self-pay | Admitting: *Deleted

## 2020-04-16 NOTE — Telephone Encounter (Signed)
Patient calling in with complaints of having red blood in urine x 2 on Saturday. Patient denies any occurrences since that time. Patient states that she initially experienced urinary frequency on Saturday as well. Patient also has complaints of lower abdominal pain. Patient denies any nausea, fever or other symptoms at this time. Attempted to schedule patient for OV with female provider per patient's preference but no availability noted until 05/08/20. Unable to contact FC at this time due to being at lunch. If possible patient would like to see if any appts were available anytime after tomorrow. Patient can be contacted at [phone]. Patient advised to return call or to seek treatment at Urgent Care/ED if symptoms become worse. Understanding verbalized.  Reason for Disposition . Blood in urine  (Exception: could be normal menstrual bleeding)  Answer Assessment - Initial Assessment Questions 1. COLOR of URINE: "Describe the color of the urine."  (e.g., tea-colored, pink, red, blood clots, bloody) Patient states that she experienced blood in urine x 2 with urinating on Saturday but denies today 2. ONSET: "When did the bleeding start?"  Patient stated that she had bleeding on Saturday  3. EPISODES: "How many times has there been blood in the urine?" or "How many times today?"     twice 4. PAIN with URINATION: "Is there any pain with passing your urine?" If Yes, ask: "How bad is the pain?"  (Scale 1-10; or mild, moderate, severe)    - MILD - complains slightly about urination hurting    - MODERATE - interferes with normal activities      - SEVERE - excruciating, unwilling or unable to urinate because of the pain  Denies any pain or burning sensation with urinating  5. FEVER: "Do you have a fever?" If Yes, ask: "What is your temperature, how was it measured, and when did it start?"     Denies fever 6. ASSOCIATED SYMPTOMS: "Are you passing urine more frequently than usual?"     Stated that she was on  Saturday but the next day it was normal 7. OTHER SYMPTOMS: "Do you have any other symptoms?" (e.g., back/flank pain, abdominal pain, vomiting)     Patient states that she has experienced lower abdominal pain that is "not very hard" 8. PREGNANCY: "Is there any chance you are pregnant?" "When was your last menstrual period?"     n/a  Protocols used: URINE - BLOOD IN-A-AH

## 2020-04-17 LAB — HM DIABETES EYE EXAM

## 2020-04-17 NOTE — Telephone Encounter (Signed)
Attempt to reach patient to schedule a lab appt. For urinalysis and a cervical cytology. Patient stated she was in another doctor office and will call back.

## 2020-04-25 MED FILL — JARDIANCE 10 MG TABLET: 10 | 30 days supply | Qty: 30 | Fill #3

## 2020-04-25 MED FILL — GLIMEPIRIDE 4 MG TABS: 4 | 30 days supply | Qty: 30 | Fill #6

## 2020-04-25 MED FILL — JANUMET 50-1,000 MG TABLET: 50-1000 | 30 days supply | Qty: 60 | Fill #2

## 2020-04-25 MED FILL — LISINOPRIL 5 MG TABLET: 5 | 30 days supply | Qty: 30 | Fill #0

## 2020-04-26 ENCOUNTER — Other Ambulatory Visit: Payer: Self-pay

## 2020-04-26 ENCOUNTER — Ambulatory Visit (INDEPENDENT_AMBULATORY_CARE_PROVIDER_SITE_OTHER): Payer: PRIVATE HEALTH INSURANCE | Admitting: Podiatry

## 2020-04-26 ENCOUNTER — Other Ambulatory Visit: Payer: Self-pay | Admitting: Podiatry

## 2020-04-26 DIAGNOSIS — Q828 Other specified congenital malformations of skin: Secondary | ICD-10-CM | POA: Diagnosis not present

## 2020-04-26 DIAGNOSIS — B351 Tinea unguium: Secondary | ICD-10-CM

## 2020-04-26 DIAGNOSIS — M79675 Pain in left toe(s): Secondary | ICD-10-CM | POA: Diagnosis not present

## 2020-04-26 DIAGNOSIS — M79674 Pain in right toe(s): Secondary | ICD-10-CM

## 2020-04-26 DIAGNOSIS — E119 Type 2 diabetes mellitus without complications: Secondary | ICD-10-CM

## 2020-04-26 MED ORDER — AMMONIUM LACTATE 12 % EX LOTN: 1.0000 "application " | TOPICAL_LOTION | CUTANEOUS | 0 refills | Status: DC | PRN

## 2020-04-29 MED FILL — AMMONIUM LACTATE 12% LOTION: 12 | 30 days supply | Qty: 400 | Fill #0

## 2020-05-01 ENCOUNTER — Other Ambulatory Visit: Payer: Self-pay | Admitting: Nurse Practitioner

## 2020-05-01 ENCOUNTER — Other Ambulatory Visit: Payer: Self-pay | Admitting: Pharmacist

## 2020-05-01 DIAGNOSIS — E1165 Type 2 diabetes mellitus with hyperglycemia: Secondary | ICD-10-CM

## 2020-05-01 MED ORDER — ACCU-CHEK SOFTCLIX LANCETS MISC: 2 refills | Status: DC

## 2020-05-01 MED ORDER — ACCU-CHEK GUIDE ME W/DEVICE KIT: PACK | 0 refills | Status: DC

## 2020-05-01 MED ORDER — ACCU-CHEK GUIDE VI STRP: ORAL_STRIP | 2 refills | Status: DC

## 2020-05-01 MED FILL — ACCU-CHEK SOFTCLIX LANCETS: 30 days supply | Qty: 100 | Fill #0

## 2020-05-01 MED FILL — ACCU-CHEK GUIDE TEST STRIP: 30 days supply | Qty: 100 | Fill #0

## 2020-05-01 MED FILL — ACCU-CHEK GUIDE W/DEVICE KI: W/DEVICE | 30 days supply | Qty: 1 | Fill #0

## 2020-05-03 ENCOUNTER — Telehealth: Payer: Self-pay

## 2020-05-03 NOTE — Telephone Encounter (Signed)
Spoke to patient and informed she needs to come in for blood work and a cbc for cotton wool spots from her ophthalmology eye office request.   Patient stated she will come get her labs done next week.

## 2020-05-05 ENCOUNTER — Encounter: Payer: Self-pay | Admitting: Podiatry

## 2020-05-05 NOTE — Progress Notes (Signed)
  Subjective:  Patient ID: Christina Fields, female    DOB: 06/22/1960,  MRN: [id number]  Chief Complaint  Patient presents with  . Callouses    B.L callus painful - recurring and painful when walking- further evaluation    60 y.o. female returns for the above complaint.  Patient presents with thickened elongated dystrophic toenails x10.  She would also like to have her nails debrided down as she is not able to do it herself.  She denies any other acute complaints.  Patient is a diabetic with last A1c of 8.8.  Patient states is painful to walk on it.  She states the right heel porokeratosis doing little bit better however there is still pain with ambulation.  She denies any other acute complaints.  Objective:  There were no vitals filed for this visit. Podiatric Exam: Vascular: dorsalis pedis and posterior tibial pulses are palpable bilateral. Capillary return is immediate. Temperature gradient is WNL. Skin turgor WNL  Sensorium: Normal Semmes Weinstein monofilament test. Normal tactile sensation bilaterally. Nail Exam: Pt has thick disfigured discolored nails with subungual debris noted bilateral entire nail hallux through fifth toenails.  Pain on palpation to the nails. Ulcer Exam: There is no evidence of ulcer or pre-ulcerative changes or infection. Orthopedic Exam: Muscle tone and strength are WNL. No limitations in general ROM. No crepitus or effusions noted. HAV  B/L.  Hammer toes 2-5  B/L. Skin: Porokeratosis noted to the right heel.  Pain on palpation.  No ulceration noted.. No infection or ulcers    Assessment & Plan:   1. Type 2 diabetes mellitus without complication, without long-term current use of insulin (HCC)   2. Porokeratosis   3. Pain due to onychomycosis of toenails of both feet     Patient was evaluated and treated and all questions answered.  Porokeratosis right heel -I explained patient the etiology of porokeratosis and various treatment options were  discussed.  I believe patient will benefit from aggressive debridement of the lesion down to healthy striated tissue.  Using chisel blade to handle the lesion was debrided down.  No pinpoint bleeding noted no underlying ulceration noted.  Onychomycosis with pain  -Nails palliatively debrided as below. -Educated on self-care  Procedure: Nail Debridement Rationale: pain  Type of Debridement: manual, sharp debridement. Instrumentation: Nail nipper, rotary burr. Number of Nails: 10  Procedures and Treatment: Consent by patient was obtained for treatment procedures. The patient understood the discussion of treatment and procedures well. All questions were answered thoroughly reviewed. Debridement of mycotic and hypertrophic toenails, 1 through 5 bilateral and clearing of subungual debris. No ulceration, no infection noted.  Return Visit-Office Procedure: Patient instructed to return to the office for a follow up visit 3 months for continued evaluation and treatment.  Nicholes Rough, DPM    No follow-ups on file.

## 2020-05-08 ENCOUNTER — Ambulatory Visit: Payer: Self-pay | Attending: Nurse Practitioner

## 2020-05-08 ENCOUNTER — Other Ambulatory Visit: Payer: Self-pay | Admitting: Nurse Practitioner

## 2020-05-08 ENCOUNTER — Other Ambulatory Visit: Payer: Self-pay

## 2020-05-08 DIAGNOSIS — E1165 Type 2 diabetes mellitus with hyperglycemia: Secondary | ICD-10-CM

## 2020-05-08 DIAGNOSIS — E1169 Type 2 diabetes mellitus with other specified complication: Secondary | ICD-10-CM

## 2020-05-08 DIAGNOSIS — H3581 Retinal edema: Secondary | ICD-10-CM

## 2020-05-08 MED ORDER — ATORVASTATIN CALCIUM 40 MG PO TABS: 40.0000 mg | ORAL_TABLET | Freq: Every day | ORAL | 1 refills | Status: DC

## 2020-05-08 MED ORDER — ACCU-CHEK GUIDE VI STRP
ORAL_STRIP | 2 refills | Status: DC
Start: 2020-05-08 — End: 2020-12-11

## 2020-05-09 LAB — CBC WITH DIFFERENTIAL/PLATELET
Basophils Absolute: 0 10*3/uL (ref 0.0–0.2)
Basos: 1 %
EOS (ABSOLUTE): 0.1 10*3/uL (ref 0.0–0.4)
Eos: 2 %
Hematocrit: 38.1 % (ref 34.0–46.6)
Hemoglobin: 12.4 g/dL (ref 11.1–15.9)
Immature Grans (Abs): 0 10*3/uL (ref 0.0–0.1)
Immature Granulocytes: 0 %
Lymphocytes Absolute: 1.7 10*3/uL (ref 0.7–3.1)
Lymphs: 25 %
MCH: 28.1 pg (ref 26.6–33.0)
MCHC: 32.5 g/dL (ref 31.5–35.7)
MCV: 86 fL (ref 79–97)
Monocytes Absolute: 0.4 10*3/uL (ref 0.1–0.9)
Monocytes: 7 %
Neutrophils Absolute: 4.3 10*3/uL (ref 1.4–7.0)
Neutrophils: 65 %
Platelets: 215 10*3/uL (ref 150–450)
RBC: 4.41 x10E6/uL (ref 3.77–5.28)
RDW: 13.5 % (ref 11.7–15.4)
WBC: 6.6 10*3/uL (ref 3.4–10.8)

## 2020-05-09 MED FILL — ATORVASTATIN CALCIUM 40 MG: 40 | 30 days supply | Qty: 30 | Fill #0

## 2020-05-16 MED FILL — GLIMEPIRIDE 4 MG TABS: 4 | 30 days supply | Qty: 60 | Fill #0

## 2020-05-27 ENCOUNTER — Other Ambulatory Visit: Payer: Self-pay | Admitting: Nurse Practitioner

## 2020-05-27 DIAGNOSIS — E1165 Type 2 diabetes mellitus with hyperglycemia: Secondary | ICD-10-CM

## 2020-05-27 MED FILL — LISINOPRIL 5 MG TABLET: 5 | 30 days supply | Qty: 30 | Fill #1

## 2020-05-28 MED FILL — JARDIANCE 10 MG TABLET: 10 | 30 days supply | Qty: 30 | Fill #0

## 2020-06-08 ENCOUNTER — Other Ambulatory Visit: Payer: Self-pay

## 2020-06-18 ENCOUNTER — Encounter: Payer: Self-pay | Admitting: Nurse Practitioner

## 2020-06-18 ENCOUNTER — Other Ambulatory Visit: Payer: Self-pay

## 2020-06-18 ENCOUNTER — Ambulatory Visit: Payer: BC Managed Care – PPO | Attending: Nurse Practitioner | Admitting: Nurse Practitioner

## 2020-06-18 DIAGNOSIS — E1169 Type 2 diabetes mellitus with other specified complication: Secondary | ICD-10-CM

## 2020-06-18 DIAGNOSIS — I10 Essential (primary) hypertension: Secondary | ICD-10-CM

## 2020-06-18 DIAGNOSIS — E119 Type 2 diabetes mellitus without complications: Secondary | ICD-10-CM | POA: Diagnosis not present

## 2020-06-18 DIAGNOSIS — E782 Mixed hyperlipidemia: Secondary | ICD-10-CM

## 2020-06-18 NOTE — Progress Notes (Signed)
Virtual Visit via Telephone Note Due to national recommendations of social distancing due to [place] 19, telehealth visit is felt to be most appropriate for this patient at this time.  I discussed the limitations, risks, security and privacy concerns of performing an evaluation and management service by telephone and the availability of in person appointments. I also discussed with the patient that there may be a patient responsible charge related to this service. The patient expressed understanding and agreed to proceed.    I connected with Christina Fields on 06/18/20  at   2:10 PM EDT  EDT by telephone and verified that I am speaking with the correct person using two identifiers.   Consent I discussed the limitations, risks, security and privacy concerns of performing an evaluation and management service by telephone and the availability of in person appointments. I also discussed with the patient that there may be a patient responsible charge related to this service. The patient expressed understanding and agreed to proceed.   Location of Patient: Private Residence   Location of Provider: [place] and [employer] Office    Persons participating in Telemedicine visit: Geryl Rankins FNP-BC [place]  Arabic Interpreter ID# [id number]   History of Present Illness: Telemedicine visit for: Follow up She has a past medical history of DM,  Hyperlipidemia, and Hypertension.  DM 2 Not well controlled. She is currently prescribed glimepiride 8 mg daily, Janumet 50-1000 mg BID. However she is not taking her medications as prescribed as she is practicing Ramadan and fasting during the day. Average readings 90-120s fasting at this time.   LDL not at goal with atorvastatin 40 mg daily. She was prescribed Jardiance but states she could not afford it through her husband's insurance so she stopped taking it a few weeks ago. She is taking renal dose  ACE however blood pressure has been labile. May need to increase to 10 mg based on BP reading at next office visit. She does not monitor her blood pressure at home.  Lab Results  Component Value Date   HGBA1C 8.8 (A) 01/26/2020   BP Readings from Last 3 Encounters:  01/26/20 (!) 145/96  07/25/19 124/86  04/04/19 (!) 148/85   Lab Results  Component Value Date   LDLCALC 88 10/27/2019      Past Medical History:  Diagnosis Date  . Diabetes mellitus without complication ([place])   . Hyperlipidemia   . Hypertension     Past Surgical History:  Procedure Laterality Date  . ANKLE SURGERY  2004  . CESAREAN SECTION     x2    Family History  Problem Relation Age of Onset  . Diabetes Mother     Social History   Socioeconomic History  . Marital status: Married    Spouse name: Not on file  . Number of children: Not on file  . Years of education: Not on file  . Highest education level: Not on file  Occupational History  . Not on file  Tobacco Use  . Smoking status: Never Smoker  . Smokeless tobacco: Never Used  Vaping Use  . Vaping Use: Never used  Substance and Sexual Activity  . Alcohol use: No  . Drug use: No  . Sexual activity: Not on file  Other Topics Concern  . Not on file  Social History Narrative  . Not on file   Social Determinants of Health   Financial Resource Strain: Not on file  Food Insecurity: Not  on file  Transportation Needs: Not on file  Physical Activity: Not on file  Stress: Not on file  Social Connections: Not on file     Observations/Objective: Awake, alert and oriented x 3   Review of Systems  Constitutional: Negative for fever, malaise/fatigue and weight loss.  HENT: Negative.  Negative for nosebleeds.   Eyes: Negative.  Negative for blurred vision, double vision and photophobia.  Respiratory: Negative.  Negative for cough and shortness of breath.   Cardiovascular: Negative.  Negative for chest pain, palpitations and leg swelling.   Gastrointestinal: Negative.  Negative for heartburn, nausea and vomiting.  Musculoskeletal: Positive for joint pain (left thumb joint pain). Negative for myalgias.  Neurological: Negative.  Negative for dizziness, focal weakness, seizures and headaches.  Psychiatric/Behavioral: Negative.  Negative for suicidal ideas.    Assessment and Plan: Diagnoses and all orders for this visit:  Type 2 diabetes mellitus without complication, without long-term current use of insulin (HCC) -     Hemoglobin A1c; Future Continue blood sugar control as discussed in office today, low carbohydrate diet, and regular physical exercise as tolerated, 150 minutes per week (30 min each day, 5 days per week, or 50 min 3 days per week). Keep blood sugar logs with fasting goal of 90-130 mg/dl, post prandial (after you eat) less than 180.  For Hypoglycemia: BS <60 and Hyperglycemia BS >400; contact the clinic ASAP. Annual eye exams and foot exams are recommended.   Combined hyperlipidemia associated with type 2 diabetes mellitus ([place]) -     Lipid panel; Future INSTRUCTIONS: Work on a low fat, heart healthy diet and participate in regular aerobic exercise program by working out at least 150 minutes per week; 5 days a week-30 minutes per day. Avoid red meat/beef/steak,  fried foods. junk foods, sodas, sugary drinks, unhealthy snacking, alcohol and smoking.  Drink at least 80 oz of water per day and monitor your carbohydrate intake daily.    Essential hypertension -     CMP14+EGFR; Future Continue all antihypertensives as prescribed.  Remember to bring in your blood pressure log with you for your follow up appointment.  DASH/Mediterranean Diets are healthier choices for HTN.     Follow Up Instructions Return in about 3 months (around 09/17/2020).     I discussed the assessment and treatment plan with the patient. The patient was provided an opportunity to ask questions and all were answered. The patient agreed with  the plan and demonstrated an understanding of the instructions.   The patient was advised to call back or seek an in-person evaluation if the symptoms worsen or if the condition fails to improve as anticipated.  I provided 25 minutes of non-face-to-face time during this encounter including median intraservice time, reviewing previous notes, labs, imaging, medications and explaining diagnosis and management.  Gildardo Pounds, FNP-BC

## 2020-06-19 ENCOUNTER — Other Ambulatory Visit: Payer: Self-pay

## 2020-06-19 ENCOUNTER — Ambulatory Visit: Payer: PRIVATE HEALTH INSURANCE | Attending: Nurse Practitioner

## 2020-06-19 DIAGNOSIS — E119 Type 2 diabetes mellitus without complications: Secondary | ICD-10-CM

## 2020-06-19 DIAGNOSIS — E1169 Type 2 diabetes mellitus with other specified complication: Secondary | ICD-10-CM

## 2020-06-19 DIAGNOSIS — I10 Essential (primary) hypertension: Secondary | ICD-10-CM

## 2020-06-20 LAB — LIPID PANEL
Chol/HDL Ratio: 4 ratio (ref 0.0–4.4)
Cholesterol, Total: 166 mg/dL (ref 100–199)
HDL: 41 mg/dL (ref >39)
LDL Chol Calc (NIH): 103 mg/dL — ABNORMAL HIGH (ref 0–99)
Triglycerides: 121 mg/dL (ref 0–149)
VLDL Cholesterol Cal: 22 mg/dL (ref 5–40)

## 2020-06-20 LAB — CMP14+EGFR
ALT: 12 IU/L (ref 0–32)
AST: 15 IU/L (ref 0–40)
Albumin/Globulin Ratio: 1.3 (ref 1.2–2.2)
Albumin: 3.8 g/dL (ref 3.8–4.9)
Alkaline Phosphatase: 70 IU/L (ref 44–121)
BUN/Creatinine Ratio: 18 (ref 12–28)
BUN: 12 mg/dL (ref 8–27)
Bilirubin Total: 0.3 mg/dL (ref 0.0–1.2)
CO2: 19 mmol/L — ABNORMAL LOW (ref 20–29)
Calcium: 9 mg/dL (ref 8.7–10.3)
Chloride: 104 mmol/L (ref 96–106)
Creatinine, Ser: 0.67 mg/dL (ref 0.57–1.00)
Globulin, Total: 2.9 g/dL (ref 1.5–4.5)
Glucose: 115 mg/dL — ABNORMAL HIGH (ref 65–99)
Potassium: 4.1 mmol/L (ref 3.5–5.2)
Sodium: 140 mmol/L (ref 134–144)
Total Protein: 6.7 g/dL (ref 6.0–8.5)
eGFR: 100 mL/min/{1.73_m2} (ref >59)

## 2020-06-20 LAB — HEMOGLOBIN A1C
Est. average glucose Bld gHb Est-mCnc: 163 mg/dL
Hgb A1c MFr Bld: 7.3 % — ABNORMAL HIGH (ref 4.8–5.6)

## 2020-06-24 ENCOUNTER — Other Ambulatory Visit: Payer: Self-pay | Admitting: Nurse Practitioner

## 2020-06-24 DIAGNOSIS — Z1231 Encounter for screening mammogram for malignant neoplasm of breast: Secondary | ICD-10-CM

## 2020-06-30 ENCOUNTER — Other Ambulatory Visit: Payer: Self-pay | Admitting: Nurse Practitioner

## 2020-06-30 DIAGNOSIS — E1165 Type 2 diabetes mellitus with hyperglycemia: Secondary | ICD-10-CM

## 2020-07-05 ENCOUNTER — Ambulatory Visit: Payer: PRIVATE HEALTH INSURANCE | Admitting: Nurse Practitioner

## 2020-08-06 ENCOUNTER — Other Ambulatory Visit: Payer: Self-pay | Admitting: Nurse Practitioner

## 2020-08-06 DIAGNOSIS — E1165 Type 2 diabetes mellitus with hyperglycemia: Secondary | ICD-10-CM

## 2020-08-06 NOTE — Telephone Encounter (Signed)
Requested Prescriptions  Pending Prescriptions Disp Refills  . glimepiride (AMARYL) 4 MG tablet [Pharmacy Med Name: GLIMEPIRIDE 4 MG TABLET] 60 tablet 1    Sig: TAKE 2 TABLETS BY MOUTH DAILY WITH BREAKFAST     Endocrinology:  Diabetes - Sulfonylureas Passed - 08/06/2020  2:34 PM      Passed - HBA1C is between 0 and 7.9 and within 180 days    HbA1c, POC (controlled diabetic range)  Date Value Ref Range Status  05/18/2018 7.6 (A) 0.0 - 7.0 % Final   Hgb A1c MFr Bld  Date Value Ref Range Status  06/19/2020 7.3 (H) 4.8 - 5.6 % Final    Comment:             Prediabetes: 5.7 - 6.4          Diabetes: >6.4          Glycemic control for adults with diabetes: <7.0          Passed - Valid encounter within last 6 months    Recent Outpatient Visits          1 month ago Type 2 diabetes mellitus without complication, without long-term current use of insulin (HCC)   [place] [facility] [place], [place] W, NP   4 months ago Type 2 diabetes mellitus with hyperglycemia, without long-term current use of insulin [facility])   [place] [facility] [place], Jeannett Senior L, RPH-CPP   5 months ago Type 2 diabetes mellitus with hyperglycemia, without long-term current use of insulin [facility])   [place] [facility] Wellness [place], Jeannett Senior L, RPH-CPP   6 months ago Type 2 diabetes mellitus with hyperglycemia, without long-term current use of insulin [facility])   [place] [facility] [place], [place] W, NP   9 months ago Type 2 diabetes mellitus with hyperglycemia, without long-term current use of insulin [facility])   [place] [facility] Wellness [place], Shea Stakes, NP      Future Appointments            In 1 month Claiborne Rigg, NP [place] [facility] [employer] [facility]

## 2020-08-07 ENCOUNTER — Other Ambulatory Visit: Payer: Self-pay

## 2020-08-07 ENCOUNTER — Ambulatory Visit
Admission: RE | Admit: 2020-08-07 | Discharge: 2020-08-07 | Disposition: A | Discharge status: Home / Self Care | Payer: PRIVATE HEALTH INSURANCE | Source: Ambulatory Visit

## 2020-08-07 DIAGNOSIS — Z1231 Encounter for screening mammogram for malignant neoplasm of breast: Secondary | ICD-10-CM

## 2020-09-02 ENCOUNTER — Other Ambulatory Visit: Payer: Self-pay | Admitting: Nurse Practitioner

## 2020-09-02 DIAGNOSIS — E1165 Type 2 diabetes mellitus with hyperglycemia: Secondary | ICD-10-CM

## 2020-09-17 ENCOUNTER — Ambulatory Visit: Payer: PRIVATE HEALTH INSURANCE | Admitting: Nurse Practitioner

## 2020-10-05 ENCOUNTER — Other Ambulatory Visit: Payer: Self-pay | Admitting: Nurse Practitioner

## 2020-10-05 DIAGNOSIS — E1165 Type 2 diabetes mellitus with hyperglycemia: Secondary | ICD-10-CM

## 2020-10-05 NOTE — Telephone Encounter (Signed)
last RF 09/02/20 #60 1 RF

## 2020-10-20 ENCOUNTER — Other Ambulatory Visit: Payer: Self-pay | Admitting: Nurse Practitioner

## 2020-10-20 DIAGNOSIS — I10 Essential (primary) hypertension: Secondary | ICD-10-CM

## 2020-10-20 NOTE — Telephone Encounter (Signed)
Requested medication (s) are due for refill today: yes  Requested medication (s) are on the active medication list: yes but med expired  Last refill:  01/26/20   Future visit scheduled: yes  Notes to clinic:  medication expired on 04/25/20   Requested Prescriptions  Pending Prescriptions Disp Refills   lisinopril (ZESTRIL) 5 MG tablet [Pharmacy Med Name: LISINOPRIL 5 MG TABLET] 90 tablet 1    Sig: TAKE 1 TABLET BY MOUTH EVERY DAY     Cardiovascular:  ACE Inhibitors Failed - 10/20/2020 10:17 AM      Failed - Last BP in normal range    BP Readings from Last 1 Encounters:  01/26/20 (!) 145/96          Passed - Cr in normal range and within 180 days    Creatinine, Ser  Date Value Ref Range Status  06/19/2020 0.67 0.57 - 1.00 mg/dL Final          Passed - K in normal range and within 180 days    Potassium  Date Value Ref Range Status  06/19/2020 4.1 3.5 - 5.2 mmol/L Final          Passed - Patient is not pregnant      Passed - Valid encounter within last 6 months    Recent Outpatient Visits           4 months ago Type 2 diabetes mellitus without complication, without long-term current use of insulin (HCC)   [place] [facility] Well[facility] [place], [place] W, NP   7 months ago Type 2 diabetes mellitus with hyperglycemia, without long-term current use of insulin [facility])   [place] [facility]ness [place], Jeannett Senior L, RPH-CPP   8 months ago Type 2 diabetes mellitus with hyperglycemia, without long-term current use of insulin [facility])   [place] [facility] [place], Jeannett Senior L, RPH-CPP   8 months ago Type 2 diabetes mellitus with hyperglycemia, without long-term current use of insulin [facility])   [place] [facility] And Wellness [place], [place] W, NP   12 months ago Type 2 diabetes mellitus with hyperglycemia, without long-term current use of insulin [facility])   [place] [facility] And Wellness [place], Shea Stakes, NP        Future Appointments             In 4 weeks Claiborne Rigg, NP [facility] [employer] [facility]

## 2020-11-02 ENCOUNTER — Other Ambulatory Visit: Payer: Self-pay | Admitting: Nurse Practitioner

## 2020-11-02 DIAGNOSIS — E1165 Type 2 diabetes mellitus with hyperglycemia: Secondary | ICD-10-CM

## 2020-11-02 NOTE — Telephone Encounter (Signed)
Requested Prescriptions  Pending Prescriptions Disp Refills  . glimepiride (AMARYL) 4 MG tablet [Pharmacy Med Name: GLIMEPIRIDE 4 MG TABLET] 60 tablet 1    Sig: TAKE 2 TABLETS BY MOUTH DAILY WITH BREAKFAST.     Endocrinology:  Diabetes - Sulfonylureas Passed - 11/02/2020 11:34 AM      Passed - HBA1C is between 0 and 7.9 and within 180 days    HbA1c, POC (controlled diabetic range)  Date Value Ref Range Status  05/18/2018 7.6 (A) 0.0 - 7.0 % Final   Hgb A1c MFr Bld  Date Value Ref Range Status  06/19/2020 7.3 (H) 4.8 - 5.6 % Final    Comment:             Prediabetes: 5.7 - 6.4          Diabetes: >6.4          Glycemic control for adults with diabetes: <7.0          Passed - Valid encounter within last 6 months    Recent Outpatient Visits          4 months ago Type 2 diabetes mellitus without complication, without long-term current use of insulin (HCC)   [place] [facility] [place], [place] W, NP   7 months ago Type 2 diabetes mellitus with hyperglycemia, without long-term current use of insulin [facility])   [place] [facility] Wellness [place], Jeannett Senior L, RPH-CPP   8 months ago Type 2 diabetes mellitus with hyperglycemia, without long-term current use of insulin [facility])   [place] [facility] And Wellness [place], Jeannett Senior L, RPH-CPP   9 months ago Type 2 diabetes mellitus with hyperglycemia, without long-term current use of insulin [facility])   [place] [facility] [place], [place] W, NP   1 year ago Type 2 diabetes mellitus with hyperglycemia, without long-term current use of insulin [facility])    [facility] [place], Shea Stakes, NP      Future Appointments            In 2 weeks Claiborne Rigg, NP [employer] [facility] Wellness

## 2020-11-14 ENCOUNTER — Other Ambulatory Visit: Payer: Self-pay | Admitting: Family Medicine

## 2020-11-14 DIAGNOSIS — I10 Essential (primary) hypertension: Secondary | ICD-10-CM

## 2020-11-14 NOTE — Telephone Encounter (Signed)
Requested medication (s) are due for refill today - no  Requested medication (s) are on the active medication list -yes  Future visit scheduled -yes  Last refill: 10/21/20  Notes to clinic: Request RF: Patient has appointment scheduled 9/12- last RF has notes- must keep appointment - should last until upcoming appointment  Requested Prescriptions  Pending Prescriptions Disp Refills   lisinopril (ZESTRIL) 5 MG tablet [Pharmacy Med Name: LISINOPRIL 5 MG TABLET] 30 tablet 0    Sig: TAKE 1 TABLET BY MOUTH EVERY DAY     Cardiovascular:  ACE Inhibitors Failed - 11/14/2020 11:36 AM      Failed - Last BP in normal range    BP Readings from Last 1 Encounters:  01/26/20 (!) 145/96          Passed - Cr in normal range and within 180 days    Creatinine, Ser  Date Value Ref Range Status  06/19/2020 0.67 0.57 - 1.00 mg/dL Final          Passed - K in normal range and within 180 days    Potassium  Date Value Ref Range Status  06/19/2020 4.1 3.5 - 5.2 mmol/L Final          Passed - Patient is not pregnant      Passed - Valid encounter within last 6 months    Recent Outpatient Visits           4 months ago Type 2 diabetes mellitus without complication, without long-term current use of insulin (HCC)   [place] [facility] [place], [place] W, NP   7 months ago Type 2 diabetes mellitus with hyperglycemia, without long-term current use of insulin (HCC)   [place] [facility] [place], Jeannett Senior L, RPH-CPP   8 months ago Type 2 diabetes mellitus with hyperglycemia, without long-term current use of insulin (HCC)   [place] [facility] [place], Jeannett Senior L, RPH-CPP   9 months ago Type 2 diabetes mellitus with hyperglycemia, without long-term current use of insulin (HCC)   [place] [facility] [place], [place] W, NP   1 year ago Type 2 diabetes mellitus with hyperglycemia, without long-term current use of  insulin (HCC)   [place] [facility] [place], Shea Stakes, NP       Future Appointments             In 4 days Claiborne Rigg, NP [facility]               Requested Prescriptions  Pending Prescriptions Disp Refills   lisinopril (ZESTRIL) 5 MG tablet [Pharmacy Med Name: LISINOPRIL 5 MG TABLET] 30 tablet 0    Sig: TAKE 1 TABLET BY MOUTH EVERY DAY     Cardiovascular:  ACE Inhibitors Failed - 11/14/2020 11:36 AM      Failed - Last BP in normal range    BP Readings from Last 1 Encounters:  01/26/20 (!) 145/96          Passed - Cr in normal range and within 180 days    Creatinine, Ser  Date Value Ref Range Status  06/19/2020 0.67 0.57 - 1.00 mg/dL Final          Passed - K in normal range and within 180 days    Potassium  Date Value Ref Range Status  06/19/2020 4.1 3.5 - 5.2 mmol/L Final  Passed - Patient is not pregnant      Passed - Valid encounter within last 6 months    Recent Outpatient Visits           4 months ago Type 2 diabetes mellitus without complication, without long-term current use of insulin (HCC)   [place] [facility] [place], [place] W, NP   7 months ago Type 2 diabetes mellitus with hyperglycemia, without long-term current use of insulin [facility])   [place] [facility] [place], Jeannett Senior L, RPH-CPP   8 months ago Type 2 diabetes mellitus with hyperglycemia, without long-term current use of insulin [facility])   [place] [facility] [place], Jeannett Senior L, RPH-CPP   9 months ago Type 2 diabetes mellitus with hyperglycemia, without long-term current use of insulin [facility])   [place] [facility] [place], [place] W, NP   1 year ago Type 2 diabetes mellitus with hyperglycemia, without long-term current use of insulin [facility])    [facility] [place], Shea Stakes, NP       Future Appointments              In 4 days Claiborne Rigg, NP [employer] [facility]

## 2020-11-18 ENCOUNTER — Ambulatory Visit: Payer: PRIVATE HEALTH INSURANCE | Attending: Nurse Practitioner | Admitting: Nurse Practitioner

## 2020-11-18 ENCOUNTER — Other Ambulatory Visit: Payer: Self-pay

## 2020-11-18 ENCOUNTER — Encounter: Payer: Self-pay | Admitting: Nurse Practitioner

## 2020-11-18 VITALS — BP 150/86 | HR 69 | Ht 69.0 in | Wt 245.0 lb

## 2020-11-18 DIAGNOSIS — E1169 Type 2 diabetes mellitus with other specified complication: Secondary | ICD-10-CM

## 2020-11-18 DIAGNOSIS — E119 Type 2 diabetes mellitus without complications: Secondary | ICD-10-CM

## 2020-11-18 DIAGNOSIS — I10 Essential (primary) hypertension: Secondary | ICD-10-CM | POA: Diagnosis not present

## 2020-11-18 DIAGNOSIS — E1165 Type 2 diabetes mellitus with hyperglycemia: Secondary | ICD-10-CM | POA: Diagnosis not present

## 2020-11-18 DIAGNOSIS — E782 Mixed hyperlipidemia: Secondary | ICD-10-CM

## 2020-11-18 DIAGNOSIS — E559 Vitamin D deficiency, unspecified: Secondary | ICD-10-CM

## 2020-11-18 DIAGNOSIS — M79645 Pain in left finger(s): Secondary | ICD-10-CM

## 2020-11-18 LAB — POCT GLYCOSYLATED HEMOGLOBIN (HGB A1C): Hemoglobin A1C: 8.6 % — AB (ref 4.0–5.6)

## 2020-11-18 LAB — GLUCOSE, POCT (MANUAL RESULT ENTRY): POC Glucose: 209 mg/dl — AB (ref 70–99)

## 2020-11-18 MED ORDER — GLIMEPIRIDE 4 MG PO TABS: 8.0000 mg | ORAL_TABLET | Freq: Every day | ORAL | 1 refills | Status: DC

## 2020-11-18 MED ORDER — LISINOPRIL 10 MG PO TABS
10.0000 mg | ORAL_TABLET | Freq: Every day | ORAL | 1 refills | Status: DC
  Filled 2020-11-18: qty 30, 30d supply, fill #0

## 2020-11-18 MED ORDER — LISINOPRIL 5 MG PO TABS
5.0000 mg | ORAL_TABLET | Freq: Every day | ORAL | 1 refills | Status: DC
  Filled 2020-11-18: qty 30, 30d supply, fill #0

## 2020-11-18 MED ORDER — ATORVASTATIN CALCIUM 40 MG PO TABS
40.0000 mg | ORAL_TABLET | Freq: Every day | ORAL | 1 refills | Status: DC
  Filled 2020-11-18: qty 30, 30d supply, fill #0

## 2020-11-18 MED ORDER — METFORMIN HCL 1000 MG PO TABS
1000.0000 mg | ORAL_TABLET | Freq: Two times a day (BID) | ORAL | 1 refills | Status: DC
  Filled 2020-11-18: qty 180, 90d supply, fill #0

## 2020-11-18 MED ORDER — JANUMET 50-1000 MG PO TABS: 1.0000 | ORAL_TABLET | Freq: Two times a day (BID) | ORAL | 1 refills | Status: DC

## 2020-11-18 MED ORDER — DICLOFENAC SODIUM 1 % EX GEL: 2.0000 g | Freq: Four times a day (QID) | CUTANEOUS | 1 refills | Status: DC

## 2020-11-18 NOTE — Progress Notes (Signed)
Daughter is translating  

## 2020-11-18 NOTE — Progress Notes (Signed)
Assessment & Plan:  Christina Fields was seen today for diabetes.  Diagnoses and all orders for this visit:  Type 2 diabetes mellitus with hyperglycemia, without long-term current use of insulin (HCC) -     CMP14+EGFR -     POCT glucose (manual entry) -     POCT glycosylated hemoglobin (Hb A1C) -     sitaGLIPtin-metformin (JANUMET) 50-1000 MG tablet; Take 1 tablet by mouth 2 (two) times daily with a meal. -     glimepiride (AMARYL) 4 MG tablet; Take 2 tablets (8 mg total) by mouth daily with breakfast.  Essential hypertension -     Discontinue: lisinopril (ZESTRIL) 5 MG tablet; Take 1 tablet (5 mg total) by mouth daily. -     CMP14+EGFR -     lisinopril (ZESTRIL) 10 MG tablet; Take 1 tablet (10 mg total) by mouth daily. -     CMP14+EGFR; Future  Combined hyperlipidemia associated with type 2 diabetes mellitus (HCC) -     atorvastatin (LIPITOR) 40 MG tablet; Take 1 tablet (40 mg total) by mouth daily. Please fill as a 90 day supply  Vitamin D deficiency disease -     VITAMIN D 25 Hydroxy (Vit-D Deficiency, Fractures)  Finger pain, left -     diclofenac Sodium (VOLTAREN) 1 % GEL; Apply 2 g topically 4 (four) times daily.  Other orders -     Discontinue: metFORMIN (GLUCOPHAGE) 1000 MG tablet; Take 1 tablet (1,000 mg total) by mouth 2 (two) times daily. -     Cancel: HgB A1c   Patient has been counseled on age-appropriate routine health concerns for screening and prevention. These are reviewed and up-to-date. Referrals have been placed accordingly. Immunizations are up-to-date or declined.    Subjective:   Chief Complaint  Patient presents with   Diabetes   HPI Christina Fields 60 y.o. female presents to office today for DM/HTN/HPL. Her daughter is here translating for her today. They have questions regarding a bill that they received. I have recommended they speak to the insurance company regarding the bill.   DM2 Blood glucose greater than 200 today. Diabetes poorly  controlled. Unfortunately there was a mix up with her medications and she has been taking metformin instead of janumet for the past 5-6 months. Her insurance would not pay for Jardiance. SHe is also taking glimepiride 8 mg daily. If diabetes unimproved at next visit will start victoza, ozempic or trulicity. LDL not at goal with atorvastatin 40 mg daily.  Lab Results  Component Value Date   HGBA1C 8.6 (A) 11/18/2020    Lab Results  Component Value Date   HGBA1C 7.3 (H) 06/19/2020    Lab Results  Component Value Date   LDLCALC 103 (H) 06/19/2020      HTN Blood pressure not well controlled. Will increase lisinopril to $RemoveBefor'10mg'THWBEqYISuid$  daily from 5 mg daily. Denies chest pain, shortness of breath, palpitations, lightheadedness, dizziness, headaches or BLE edema.  BP Readings from Last 3 Encounters:  11/18/20 (!) 150/86  01/26/20 (!) 145/96  07/25/19 124/86     Joint Pain: Patient complains of  left index finger pain  for which has been present on and off for several weeks. Pain is located in the left DIP(s): 4th, is described as shooting and stabbing, and is intermittent .  Associated symptoms include: none.  The patient has tried nothing for pain relief.  Related to injury:   no.    Review of Systems  Constitutional:  Negative for fever,  malaise/fatigue and weight loss.  HENT: Negative.  Negative for nosebleeds.   Eyes: Negative.  Negative for blurred vision, double vision and photophobia.  Respiratory: Negative.  Negative for cough and shortness of breath.   Cardiovascular: Negative.  Negative for chest pain, palpitations and leg swelling.  Gastrointestinal: Negative.  Negative for heartburn, nausea and vomiting.  Musculoskeletal:  Positive for joint pain. Negative for myalgias.  Neurological: Negative.  Negative for dizziness, focal weakness, seizures and headaches.  Psychiatric/Behavioral: Negative.  Negative for suicidal ideas.    Past Medical History:  Diagnosis Date   Diabetes mellitus  without complication ([place])    Hyperlipidemia    Hypertension     Past Surgical History:  Procedure Laterality Date   ANKLE SURGERY  2004   CESAREAN SECTION     x2    Family History  Problem Relation Age of Onset   Diabetes Mother     Social History Reviewed with no changes to be made today.   Outpatient Medications Prior to Visit  Medication Sig Dispense Refill   Accu-Chek Softclix Lancets lancets Check blood glucose level by fingerstick twice per day. E11.65 100 each 2   ammonium lactate (LAC-HYDRIN) 12 % lotion APPLY 1 APPLICATION TOPICALLY AS NEEDED FOR DRY SKIN. 400 g 0   Blood Glucose Monitoring Suppl (ACCU-CHEK GUIDE) w/Device KIT CHECK BLOOD GLUCOSE LEVEL BY FINGERSTICK TWICE PER DAY. E11.65 1 kit 0   cyclobenzaprine (FLEXERIL) 5 MG tablet Take 1 tablet (5 mg total) by mouth 3 (three) times daily as needed for muscle spasms. 60 tablet 1   glucose blood (ACCU-CHEK GUIDE) test strip Check blood glucose level by fingerstick twice per day. E11.65 100 each 2   Lancet Devices (MICROLET NEXT LANCING DEVICE) MISC 1 Device by Does not apply route daily. 1 each 0   Vitamin D, Ergocalciferol, (DRISDOL) 1.25 MG (50000 UNIT) CAPS capsule Take 1 capsule (50,000 Units total) by mouth every 7 (seven) days. 12 capsule 0   Blood Glucose Monitoring Suppl (ACCU-CHEK GUIDE ME) w/Device KIT Check blood glucose level by fingerstick twice per day. E11.65 1 kit 0   glimepiride (AMARYL) 4 MG tablet TAKE 2 TABLETS BY MOUTH DAILY WITH BREAKFAST. 60 tablet 1   Insulin Pen Needle (B-D UF III MINI PEN NEEDLES) 31G X 5 MM MISC Use as instructed. Inject into the skin once daily 100 each 6   lisinopril (ZESTRIL) 5 MG tablet TAKE 1 TABLET BY MOUTH EVERY DAY 30 tablet 0   sitaGLIPtin-metformin (JANUMET) 50-1000 MG tablet TAKE 1 TABLET BY MOUTH 2 (TWO) TIMES DAILY WITH A MEAL. 60 tablet 2   atorvastatin (LIPITOR) 40 MG tablet Take 1 tablet (40 mg total) by mouth daily. Please fill as a 90 day supply 90 tablet 1    metFORMIN (GLUCOPHAGE) 1000 MG tablet Take 1,000 mg by mouth 2 (two) times daily.     No facility-administered medications prior to visit.    No Known Allergies     Objective:    BP (!) 150/86   Pulse 69   Ht $R'5\' 9"'qx$  (1.753 m)   Wt 245 lb (111.1 kg)   LMP  (LMP Unknown)   SpO2 100%   BMI 36.18 kg/m  Wt Readings from Last 3 Encounters:  11/18/20 245 lb (111.1 kg)  01/26/20 251 lb (113.9 kg)  07/25/19 246 lb (111.6 kg)    Physical Exam Vitals and nursing note reviewed.  Constitutional:      Appearance: She is well-developed.  HENT:  Head: Normocephalic and atraumatic.  Cardiovascular:     Rate and Rhythm: Normal rate and regular rhythm.     Heart sounds: Normal heart sounds. No murmur heard.   No friction rub. No gallop.  Pulmonary:     Effort: Pulmonary effort is normal. No tachypnea or respiratory distress.     Breath sounds: Normal breath sounds. No decreased breath sounds, wheezing, rhonchi or rales.  Chest:     Chest wall: No tenderness.  Abdominal:     General: Bowel sounds are normal.     Palpations: Abdomen is soft.  Musculoskeletal:        General: Normal range of motion.     Right hand: Normal.     Left hand: Normal.     Cervical back: Normal range of motion.  Skin:    General: Skin is warm and dry.  Neurological:     Mental Status: She is alert and oriented to person, place, and time.     Coordination: Coordination normal.  Psychiatric:        Behavior: Behavior normal. Behavior is cooperative.        Thought Content: Thought content normal.        Judgment: Judgment normal.         Patient has been counseled extensively about nutrition and exercise as well as the importance of adherence with medications and regular follow-up. The patient was given clear instructions to go to ER or return to medical center if symptoms don't improve, worsen or new problems develop. The patient verbalized understanding.   Follow-up: Return for needs labs in 4  weeks. See me in 3 months.   Gildardo Pounds, FNP-BC [facility] [place], [place]   11/18/2020, 8:45 PM

## 2020-11-19 LAB — CMP14+EGFR
ALT: 8 IU/L (ref 0–32)
AST: 11 IU/L (ref 0–40)
Albumin/Globulin Ratio: 1.3 (ref 1.2–2.2)
Albumin: 3.9 g/dL (ref 3.8–4.9)
Alkaline Phosphatase: 82 IU/L (ref 44–121)
BUN/Creatinine Ratio: 24 (ref 12–28)
BUN: 19 mg/dL (ref 8–27)
Bilirubin Total: <0.2 mg/dL (ref 0.0–1.2)
CO2: 22 mmol/L (ref 20–29)
Calcium: 9.2 mg/dL (ref 8.7–10.3)
Chloride: 106 mmol/L (ref 96–106)
Creatinine, Ser: 0.79 mg/dL (ref 0.57–1.00)
Globulin, Total: 2.9 g/dL (ref 1.5–4.5)
Glucose: 206 mg/dL — ABNORMAL HIGH (ref 65–99)
Potassium: 4.4 mmol/L (ref 3.5–5.2)
Sodium: 141 mmol/L (ref 134–144)
Total Protein: 6.8 g/dL (ref 6.0–8.5)
eGFR: 86 mL/min/{1.73_m2} (ref >59)

## 2020-11-19 LAB — VITAMIN D 25 HYDROXY (VIT D DEFICIENCY, FRACTURES): Vit D, 25-Hydroxy: 20.8 ng/mL — ABNORMAL LOW (ref 30.0–100.0)

## 2020-11-22 ENCOUNTER — Encounter: Payer: Self-pay | Admitting: Nurse Practitioner

## 2020-11-22 ENCOUNTER — Other Ambulatory Visit: Payer: Self-pay

## 2020-11-23 ENCOUNTER — Other Ambulatory Visit: Payer: Self-pay | Admitting: Nurse Practitioner

## 2020-11-23 DIAGNOSIS — I10 Essential (primary) hypertension: Secondary | ICD-10-CM

## 2020-11-24 NOTE — Telephone Encounter (Signed)
Called [facility] and it was removed from system

## 2020-11-25 ENCOUNTER — Other Ambulatory Visit: Payer: Self-pay

## 2020-11-25 ENCOUNTER — Other Ambulatory Visit: Payer: Self-pay | Admitting: Nurse Practitioner

## 2020-11-25 MED ORDER — METFORMIN HCL 1000 MG PO TABS
1000.0000 mg | ORAL_TABLET | Freq: Two times a day (BID) | ORAL | 3 refills | Status: DC
  Filled 2020-11-25: qty 60, 30d supply, fill #0

## 2020-11-25 MED ORDER — DAPAGLIFLOZIN PROPANEDIOL 10 MG PO TABS
10.0000 mg | ORAL_TABLET | Freq: Every day | ORAL | 3 refills | Status: DC
  Filled 2020-11-25: qty 30, 30d supply, fill #0

## 2020-11-26 ENCOUNTER — Other Ambulatory Visit: Payer: Self-pay

## 2020-11-26 ENCOUNTER — Other Ambulatory Visit: Payer: Self-pay | Admitting: Nurse Practitioner

## 2020-11-26 DIAGNOSIS — I10 Essential (primary) hypertension: Secondary | ICD-10-CM

## 2020-11-26 MED ORDER — LISINOPRIL 10 MG PO TABS: 10.0000 mg | ORAL_TABLET | Freq: Every day | ORAL | 1 refills | Status: DC

## 2020-11-26 MED ORDER — DAPAGLIFLOZIN PROPANEDIOL 10 MG PO TABS: 10.0000 mg | ORAL_TABLET | Freq: Every day | ORAL | 3 refills | Status: DC

## 2020-12-03 ENCOUNTER — Other Ambulatory Visit: Payer: Self-pay

## 2020-12-09 ENCOUNTER — Other Ambulatory Visit: Payer: Self-pay

## 2020-12-11 ENCOUNTER — Telehealth: Payer: Self-pay

## 2020-12-11 ENCOUNTER — Other Ambulatory Visit: Payer: Self-pay | Admitting: Nurse Practitioner

## 2020-12-11 DIAGNOSIS — E1165 Type 2 diabetes mellitus with hyperglycemia: Secondary | ICD-10-CM

## 2020-12-11 NOTE — Telephone Encounter (Signed)
PA for voltaren gel denied

## 2020-12-18 ENCOUNTER — Other Ambulatory Visit: Payer: Self-pay

## 2020-12-18 ENCOUNTER — Ambulatory Visit: Payer: PRIVATE HEALTH INSURANCE | Attending: Nurse Practitioner

## 2020-12-18 NOTE — Addendum Note (Signed)
Addended by: Nicanor Alcon on: 12/18/2020 01:44 PM   Modules accepted: Orders

## 2020-12-19 LAB — CMP14+EGFR
ALT: 11 IU/L (ref 0–32)
AST: 11 IU/L (ref 0–40)
Albumin/Globulin Ratio: 1.5 (ref 1.2–2.2)
Albumin: 4.1 g/dL (ref 3.8–4.9)
Alkaline Phosphatase: 73 IU/L (ref 44–121)
BUN/Creatinine Ratio: 21 (ref 12–28)
BUN: 13 mg/dL (ref 8–27)
Bilirubin Total: <0.2 mg/dL (ref 0.0–1.2)
CO2: 23 mmol/L (ref 20–29)
Calcium: 9.1 mg/dL (ref 8.7–10.3)
Chloride: 104 mmol/L (ref 96–106)
Creatinine, Ser: 0.61 mg/dL (ref 0.57–1.00)
Globulin, Total: 2.7 g/dL (ref 1.5–4.5)
Glucose: 113 mg/dL — ABNORMAL HIGH (ref 70–99)
Potassium: 4.6 mmol/L (ref 3.5–5.2)
Sodium: 140 mmol/L (ref 134–144)
Total Protein: 6.8 g/dL (ref 6.0–8.5)
eGFR: 102 mL/min/{1.73_m2} (ref >59)

## 2021-01-17 ENCOUNTER — Encounter: Payer: Self-pay | Admitting: Nurse Practitioner

## 2021-02-17 ENCOUNTER — Ambulatory Visit: Payer: PRIVATE HEALTH INSURANCE | Admitting: Nurse Practitioner

## 2021-03-05 ENCOUNTER — Encounter: Payer: Self-pay | Admitting: Nurse Practitioner

## 2021-03-24 ENCOUNTER — Ambulatory Visit (HOSPITAL_COMMUNITY)
Admission: EM | Admit: 2021-03-24 | Discharge: 2021-03-24 | Disposition: A | Discharge status: Home / Self Care | Payer: BC Managed Care – PPO | Attending: Internal Medicine | Admitting: Internal Medicine

## 2021-03-24 ENCOUNTER — Encounter (HOSPITAL_COMMUNITY): Payer: Self-pay | Admitting: Emergency Medicine

## 2021-03-24 ENCOUNTER — Other Ambulatory Visit: Payer: Self-pay

## 2021-03-24 DIAGNOSIS — M79645 Pain in left finger(s): Secondary | ICD-10-CM | POA: Diagnosis not present

## 2021-03-24 DIAGNOSIS — M533 Sacrococcygeal disorders, not elsewhere classified: Secondary | ICD-10-CM | POA: Diagnosis not present

## 2021-03-24 DIAGNOSIS — M791 Myalgia, unspecified site: Secondary | ICD-10-CM

## 2021-03-24 MED ORDER — CYCLOBENZAPRINE HCL 5 MG PO TABS: 5.0000 mg | ORAL_TABLET | Freq: Every evening | ORAL | 0 refills | Status: DC | PRN

## 2021-03-24 MED ORDER — MELOXICAM 7.5 MG PO TABS: 7.5000 mg | ORAL_TABLET | Freq: Every day | ORAL | 1 refills | Status: DC

## 2021-03-24 MED ORDER — DICLOFENAC SODIUM 1 % EX GEL: 2.0000 g | Freq: Four times a day (QID) | CUTANEOUS | 1 refills | Status: DC

## 2021-03-24 NOTE — ED Provider Notes (Signed)
[place]    CSN: [id number] Arrival date & time: 03/24/21  1741      History   Chief Complaint Chief Complaint  Patient presents with   Back Pain    HPI Christina Abdelrahim MO Escalante is a 61 y.o. female comes to the urgent care with low back pain which started 4 months ago.  Patient describes the pain as sharp and stabbing, currently 8 out of 10, aggravated by movement and partially relieved by using Biofreeze.  Patient denies any fall or trauma to the back.  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  Her job involves some heavy lifting.  Pain does not radiate to the lower extremities.  No bladder or bowel problems.  No weakness in the lower extremities.   HPI  Past Medical History:  Diagnosis Date   Diabetes mellitus without complication ([place])    Hyperlipidemia    Hypertension     Patient Active Problem List   Diagnosis Date Noted   Combined hyperlipidemia associated with type 2 diabetes mellitus ([place]) 07/06/2017   Allergic rhinitis 01/27/2017   Essential hypertension 01/27/2017   Type 2 diabetes mellitus without complication, without long-term current use of insulin ([place]) 01/27/2017    Past Surgical History:  Procedure Laterality Date   ANKLE SURGERY  2004   CESAREAN SECTION     x2    OB History   No obstetric history on file.      Home Medications    Prior to Admission medications   Medication Sig Start Date End Date Taking? Authorizing Provider  meloxicam (MOBIC) 7.5 MG tablet Take 1 tablet (7.5 mg total) by mouth daily. 03/24/21  Yes Annie Saephan, Myrene Galas, MD  Accu-Chek Softclix Lancets lancets Check blood glucose level by fingerstick twice per day. E11.65 05/01/20   Charlott Rakes, MD  ammonium lactate (LAC-HYDRIN) 12 % lotion APPLY 1 APPLICATION TOPICALLY AS NEEDED FOR DRY SKIN. 04/26/20 04/26/21  Felipa Furnace, DPM  atorvastatin (LIPITOR) 40 MG tablet Take 1 tablet (40 mg total) by mouth daily. Please fill as a 90 day supply  11/18/20 02/16/21  Gildardo Pounds, NP  Blood Glucose Monitoring Suppl (ACCU-CHEK GUIDE) w/Device KIT CHECK BLOOD GLUCOSE LEVEL BY FINGERSTICK TWICE PER DAY. E11.65 05/01/20 05/01/21  Charlott Rakes, MD  cyclobenzaprine (FLEXERIL) 5 MG tablet Take 1 tablet (5 mg total) by mouth at bedtime as needed for muscle spasms. 03/24/21   Emeli Goguen, Myrene Galas, MD  dapagliflozin propanediol (FARXIGA) 10 MG TABS tablet Take 1 tablet (10 mg total) by mouth daily before breakfast. 11/26/20   Gildardo Pounds, NP  diclofenac Sodium (VOLTAREN) 1 % GEL Apply 2 g topically 4 (four) times daily. 03/24/21   Chase Picket, MD  glimepiride (AMARYL) 4 MG tablet Take 2 tablets (8 mg total) by mouth daily with breakfast. 11/18/20 02/16/21  Gildardo Pounds, NP  glucose blood (ACCU-CHEK GUIDE) test strip USE TO CHECK BLOOD GLUCOSE BY FINGERSTICK TWICE A DAY 12/11/20   Gildardo Pounds, NP  Lancet Devices (MICROLET NEXT LANCING DEVICE) MISC 1 Device by Does not apply route daily. 07/25/19   Gildardo Pounds, NP  lisinopril (ZESTRIL) 10 MG tablet Take 1 tablet (10 mg total) by mouth daily. 11/26/20 02/24/21  Gildardo Pounds, NP  metFORMIN (GLUCOPHAGE) 1000 MG tablet TAKE 1 TABLET BY MOUTH TWICE A DAY WITH MEALS 11/26/20   Gildardo Pounds, NP  Vitamin D, Ergocalciferol, (DRISDOL) 1.25 MG (50000 UNIT) CAPS capsule Take  capsule (50,000 Units total) by mouth every 7 (seven) days. 04/04/19   Fleming, Zelda W, NP  ° ° °Family History °Family History  °Problem Relation Age of Onset  ° Diabetes Mother   ° ° °Social History °Social History  ° °Tobacco Use  ° Smoking status: Never  ° Smokeless tobacco: Never  °Vaping Use  ° Vaping Use: Never used  °Substance Use Topics  ° Alcohol use: No  ° Drug use: No  ° ° ° °Allergies   °Patient has no known allergies. ° ° °Review of Systems °Review of Systems  °Constitutional: Negative.   °Gastrointestinal: Negative.   °Genitourinary:  Negative for dysuria, frequency and vaginal discharge.  °Musculoskeletal:   Positive for arthralgias and back pain. Negative for myalgias and neck pain.  °Psychiatric/Behavioral: Negative.    ° ° °Physical Exam °Triage Vital Signs °ED Triage Vitals [03/24/21 1831]  °Enc Vitals Group  °   BP (!) 148/73  °   Pulse Rate 64  °   Resp 16  °   Temp 98.1 °F (36.7 °C)  °   Temp Source Oral  °   SpO2 100 %  °   Weight   °   Height   °   Head Circumference   °   Peak Flow   °   Pain Score   °   Pain Loc   °   Pain Edu?   °   Excl. in GC?   ° °No data found. ° °Updated Vital Signs °BP (!) 148/73    Pulse 64    Temp 98.1 °F (36.7 °C) (Oral)    Resp 16    LMP  (LMP Unknown)    SpO2 100%  ° °Visual Acuity °Right Eye Distance:   °Left Eye Distance:   °Bilateral Distance:   ° °Right Eye Near:   °Left Eye Near:    °Bilateral Near:    ° °Physical Exam °Vitals and nursing note reviewed.  °Constitutional:   °   General: She is not in acute distress. °   Appearance: She is not ill-appearing.  °Cardiovascular:  °   Rate and Rhythm: Normal rate and regular rhythm.  °Pulmonary:  °   Effort: Pulmonary effort is normal.  °   Breath sounds: Normal breath sounds.  °Abdominal:  °   General: Bowel sounds are normal.  °   Palpations: Abdomen is soft.  °Musculoskeletal:     °   General: Tenderness present. Normal range of motion.  °   Comments: Tenderness over the right sacroiliac joint.  No bruising.  Deep tendon reflexes are 2+.  °Neurological:  °   General: No focal deficit present.  °   Mental Status: She is alert and oriented to person, place, and time.  ° ° ° °UC Treatments / Results  °Labs °(all labs ordered are listed, but only abnormal results are displayed) °Labs Reviewed - No data to display ° °EKG ° ° °Radiology °No results found. ° °Procedures °Procedures (including critical care time) ° °Medications Ordered in UC °Medications - No data to display ° °Initial Impression / Assessment and Plan / UC Course  °I have reviewed the triage vital signs and the nursing notes. ° °Pertinent labs & imaging results that  were available during my care of the patient were reviewed by me and considered in my medical decision making (see chart for details). ° °  ° °1.  Left sacroiliac pain: °Gentle range of motion exercises °Back strengthening   exercises °Flexeril at bedtime with precautions °Meloxicam daily °Heating pad use will help with back pain °Diclofenac gel as needed °Return precautions given °No indications for imaging °Final Clinical Impressions(s) / UC Diagnoses  ° °Final diagnoses:  °Pain of left sacroiliac joint  ° ° ° °Discharge Instructions   ° °  °Gentle range of motion exercises °Take medications as prescribed °Heating pad use on a 20-minute on-20 minutes off cycle will help with back pain °Do not operate heavy machinery or drive after taking muscle relaxants.  Muscle relaxants will make you drowsy. °If symptoms worsen please return to urgent care to be reevaluated. ° ° °ED Prescriptions   ° ° Medication Sig Dispense Auth. Provider  ° cyclobenzaprine (FLEXERIL) 5 MG tablet Take 1 tablet (5 mg total) by mouth at bedtime as needed for muscle spasms. 20 tablet Lamptey, Philip O, MD  ° diclofenac Sodium (VOLTAREN) 1 % GEL Apply 2 g topically 4 (four) times daily. 100 g Lamptey, Philip O, MD  ° meloxicam (MOBIC) 7.5 MG tablet Take 1 tablet (7.5 mg total) by mouth daily. 30 tablet Lamptey, Philip O, MD  ° °  ° °PDMP not reviewed this encounter. °  °Lamptey, Philip O, MD °03/24/21 1925 ° °

## 2021-03-24 NOTE — ED Triage Notes (Signed)
PT reports back pain, no injury. She has had pain for 4+ months, but it has worsened in the last week. Pain is worst over right lower back.

## 2021-03-24 NOTE — Discharge Instructions (Addendum)
Gentle range of motion exercises Take medications as prescribed Heating pad use on a 20-minute on-20 minutes off cycle will help with back pain Do not operate heavy machinery or drive after taking muscle relaxants.  Muscle relaxants will make you drowsy. If symptoms worsen please return to urgent care to be reevaluated.

## 2021-04-07 ENCOUNTER — Encounter: Payer: Self-pay | Admitting: Nurse Practitioner

## 2021-04-08 ENCOUNTER — Other Ambulatory Visit: Payer: Self-pay

## 2021-04-08 ENCOUNTER — Other Ambulatory Visit: Payer: Self-pay | Admitting: Nurse Practitioner

## 2021-04-08 MED ORDER — DAPAGLIFLOZIN PROPANEDIOL 10 MG PO TABS
10.0000 mg | ORAL_TABLET | Freq: Every day | ORAL | 0 refills | Status: DC
  Filled 2021-04-08: qty 30, 30d supply, fill #0

## 2021-04-16 ENCOUNTER — Other Ambulatory Visit: Payer: Self-pay | Admitting: Nurse Practitioner

## 2021-04-16 MED ORDER — DAPAGLIFLOZIN PROPANEDIOL 10 MG PO TABS: 10.0000 mg | ORAL_TABLET | Freq: Every day | ORAL | 0 refills | Status: DC

## 2021-04-30 ENCOUNTER — Ambulatory Visit: Payer: BC Managed Care – PPO | Attending: Nurse Practitioner | Admitting: Nurse Practitioner

## 2021-04-30 ENCOUNTER — Other Ambulatory Visit: Payer: Self-pay

## 2021-04-30 ENCOUNTER — Encounter: Payer: Self-pay | Admitting: Nurse Practitioner

## 2021-04-30 VITALS — BP 108/73 | HR 61 | Resp 18 | Ht 69.0 in | Wt 232.5 lb

## 2021-04-30 DIAGNOSIS — E1169 Type 2 diabetes mellitus with other specified complication: Secondary | ICD-10-CM

## 2021-04-30 DIAGNOSIS — I10 Essential (primary) hypertension: Secondary | ICD-10-CM

## 2021-04-30 DIAGNOSIS — E1165 Type 2 diabetes mellitus with hyperglycemia: Secondary | ICD-10-CM

## 2021-04-30 DIAGNOSIS — M791 Myalgia, unspecified site: Secondary | ICD-10-CM

## 2021-04-30 DIAGNOSIS — E782 Mixed hyperlipidemia: Secondary | ICD-10-CM

## 2021-04-30 DIAGNOSIS — Z1211 Encounter for screening for malignant neoplasm of colon: Secondary | ICD-10-CM

## 2021-04-30 LAB — POCT GLYCOSYLATED HEMOGLOBIN (HGB A1C): Hemoglobin A1C: 7.8 % — AB (ref 4.0–5.6)

## 2021-04-30 LAB — GLUCOSE, POCT (MANUAL RESULT ENTRY): POC Glucose: 154 mg/dl — AB (ref 70–99)

## 2021-04-30 MED ORDER — CYCLOBENZAPRINE HCL 5 MG PO TABS
5.0000 mg | ORAL_TABLET | Freq: Every evening | ORAL | 3 refills | Status: DC | PRN
  Filled 2021-04-30: qty 30, 30d supply, fill #0

## 2021-04-30 MED ORDER — MELOXICAM 7.5 MG PO TABS
7.5000 mg | ORAL_TABLET | Freq: Every day | ORAL | 3 refills | Status: DC
  Filled 2021-04-30: qty 30, 30d supply, fill #0

## 2021-04-30 MED ORDER — LISINOPRIL 10 MG PO TABS
10.0000 mg | ORAL_TABLET | Freq: Every day | ORAL | 1 refills | Status: DC
  Filled 2021-04-30: qty 30, 30d supply, fill #0

## 2021-04-30 MED ORDER — ATORVASTATIN CALCIUM 40 MG PO TABS
40.0000 mg | ORAL_TABLET | Freq: Every day | ORAL | 1 refills | Status: DC
  Filled 2021-04-30: qty 30, 30d supply, fill #0

## 2021-04-30 NOTE — Progress Notes (Signed)
Right  Assessment & Plan:  Christina Fields was seen today for diabetes.  Diagnoses and all orders for this visit:  Type 2 diabetes mellitus with hyperglycemia, without long-term current use of insulin (HCC) -     POCT glycosylated hemoglobin (Hb A1C) -     POCT glucose (manual entry) -     CMP14+EGFR -     Microalbumin / creatinine urine ratio  Essential hypertension -     CMP14+EGFR -     lisinopril (ZESTRIL) 10 MG tablet; Take 1 tablet (10 mg total) by mouth daily.  Combined hyperlipidemia associated with type 2 diabetes mellitus (HCC) -     atorvastatin (LIPITOR) 40 MG tablet; Take 1 tablet (40 mg total) by mouth daily. Please fill as a 90 day supply  Myalgia -     meloxicam (MOBIC) 7.5 MG tablet; Take 1 tablet (7.5 mg total) by mouth daily. -     cyclobenzaprine (FLEXERIL) 5 MG tablet; Take 1 tablet (5 mg total) by mouth at bedtime as needed for muscle spasms.  Colon cancer screening -     Fecal occult blood, imunochemical([facility])    Patient has been counseled on age-appropriate routine health concerns for screening and prevention. These are reviewed and up-to-date. Referrals have been placed accordingly. Immunizations are up-to-date or declined.    Subjective:   Chief Complaint  Patient presents with   Diabetes   HPI Christina Fields 61 y.o. female presents to office today for follow-up to diabetes and hypertension.  She is accompanied by her daughter today. She has a past medical history of DM2,  Hyperlipidemia, and Hypertension.   HTN Blood pressure well controlled today with lisinopril 10 mg daily. BP Readings from Last 3 Encounters:  04/30/21 108/73  03/24/21 (!) 148/73  11/18/20 (!) 150/86     DM 2 Diabetes is improving with A1c down to 7.8 from 8.6.  Her weight is also down about 13 pounds.  She has increased her activity due to a new job that she has which involves lots of walking.  She is taking glimepiride 8 mg daily and will be picking up  her Farxiga 10 mg daily today. Lab Results  Component Value Date   HGBA1C 7.8 (A) 04/30/2021    Review of Systems  Constitutional:  Negative for fever, malaise/fatigue and weight loss.  HENT: Negative.  Negative for nosebleeds.   Eyes: Negative.  Negative for blurred vision, double vision and photophobia.  Respiratory: Negative.  Negative for cough and shortness of breath.   Cardiovascular: Negative.  Negative for chest pain, palpitations and leg swelling.  Gastrointestinal: Negative.  Negative for heartburn, nausea and vomiting.  Musculoskeletal:  Positive for back pain. Negative for myalgias.  Neurological: Negative.  Negative for dizziness, focal weakness, seizures and headaches.  Psychiatric/Behavioral: Negative.  Negative for suicidal ideas.    Past Medical History:  Diagnosis Date   Diabetes mellitus without complication ([place])    Hyperlipidemia    Hypertension     Past Surgical History:  Procedure Laterality Date   ANKLE SURGERY  2004   CESAREAN SECTION     x2    Family History  Problem Relation Age of Onset   Diabetes Mother     Social History Reviewed with no changes to be made today.   Outpatient Medications Prior to Visit  Medication Sig Dispense Refill   Accu-Chek Softclix Lancets lancets Check blood glucose level by fingerstick twice per day. E11.65 100 each 2   Blood Glucose Monitoring Suppl (  ACCU-CHEK GUIDE) w/Device KIT CHECK BLOOD GLUCOSE LEVEL BY FINGERSTICK TWICE PER DAY. E11.65 1 kit 0   dapagliflozin propanediol (FARXIGA) 10 MG TABS tablet Take 1 tablet (10 mg total) by mouth daily before breakfast. Please fill as a 90 day supply 90 tablet 0   diclofenac Sodium (VOLTAREN) 1 % GEL Apply 2 g topically 4 (four) times daily. 100 g 1   glucose blood (ACCU-CHEK GUIDE) test strip USE TO CHECK BLOOD GLUCOSE BY FINGERSTICK TWICE A DAY 200 strip 1   Lancet Devices (MICROLET NEXT LANCING DEVICE) MISC 1 Device by Does not apply route daily. 1 each 0    cyclobenzaprine (FLEXERIL) 5 MG tablet Take 1 tablet (5 mg total) by mouth at bedtime as needed for muscle spasms. 20 tablet 0   meloxicam (MOBIC) 7.5 MG tablet Take 1 tablet (7.5 mg total) by mouth daily. 30 tablet 1   metFORMIN (GLUCOPHAGE) 1000 MG tablet TAKE 1 TABLET BY MOUTH TWICE A DAY WITH MEALS 180 tablet 1   glimepiride (AMARYL) 4 MG tablet Take 2 tablets (8 mg total) by mouth daily with breakfast. 180 tablet 1   Vitamin D, Ergocalciferol, (DRISDOL) 1.25 MG (50000 UNIT) CAPS capsule Take 1 capsule (50,000 Units total) by mouth every 7 (seven) days. (Patient not taking: Reported on 04/30/2021) 12 capsule 0   atorvastatin (LIPITOR) 40 MG tablet Take 1 tablet (40 mg total) by mouth daily. Please fill as a 90 day supply 90 tablet 1   lisinopril (ZESTRIL) 10 MG tablet Take 1 tablet (10 mg total) by mouth daily. 90 tablet 1   No facility-administered medications prior to visit.    No Known Allergies     Objective:    BP 108/73    Pulse 61    Resp 18    Ht $R'5\' 9"'UO$  (1.753 m)    Wt 232 lb 8 oz (105.5 kg)    LMP  (LMP Unknown)    SpO2 100%    BMI 34.33 kg/m  Wt Readings from Last 3 Encounters:  04/30/21 232 lb 8 oz (105.5 kg)  11/18/20 245 lb (111.1 kg)  01/26/20 251 lb (113.9 kg)    Physical Exam Vitals and nursing note reviewed.  Constitutional:      Appearance: She is well-developed.  HENT:     Head: Normocephalic and atraumatic.  Cardiovascular:     Rate and Rhythm: Normal rate and regular rhythm.     Heart sounds: Normal heart sounds. No murmur heard.   No friction rub. No gallop.  Pulmonary:     Effort: Pulmonary effort is normal. No tachypnea or respiratory distress.     Breath sounds: Normal breath sounds. No decreased breath sounds, wheezing, rhonchi or rales.  Chest:     Chest wall: No tenderness.  Abdominal:     General: Bowel sounds are normal.     Palpations: Abdomen is soft.  Musculoskeletal:        General: Normal range of motion.     Cervical back: Normal range  of motion.  Skin:    General: Skin is warm and dry.  Neurological:     Mental Status: She is alert and oriented to person, place, and time.     Coordination: Coordination normal.  Psychiatric:        Behavior: Behavior normal. Behavior is cooperative.        Thought Content: Thought content normal.        Judgment: Judgment normal.  Patient has been counseled extensively about nutrition and exercise as well as the importance of adherence with medications and regular follow-up. The patient was given clear instructions to go to ER or return to medical center if symptoms don't improve, worsen or new problems develop. The patient verbalized understanding.   Follow-up: Return in about 3 months (around 07/28/2021) for HTN/HPL/DM.   Gildardo Pounds, FNP-BC [facility] [place], [place]   04/30/2021, 5:17 PM

## 2021-05-01 LAB — CMP14+EGFR
ALT: 12 IU/L (ref 0–32)
AST: 12 IU/L (ref 0–40)
Albumin/Globulin Ratio: 1.4 (ref 1.2–2.2)
Albumin: 4.2 g/dL (ref 3.8–4.8)
Alkaline Phosphatase: 83 IU/L (ref 44–121)
BUN/Creatinine Ratio: 28 (ref 12–28)
BUN: 16 mg/dL (ref 8–27)
Bilirubin Total: <0.2 mg/dL (ref 0.0–1.2)
CO2: 25 mmol/L (ref 20–29)
Calcium: 9.7 mg/dL (ref 8.7–10.3)
Chloride: 106 mmol/L (ref 96–106)
Creatinine, Ser: 0.58 mg/dL (ref 0.57–1.00)
Globulin, Total: 3 g/dL (ref 1.5–4.5)
Glucose: 132 mg/dL — ABNORMAL HIGH (ref 70–99)
Potassium: 4.6 mmol/L (ref 3.5–5.2)
Sodium: 144 mmol/L (ref 134–144)
Total Protein: 7.2 g/dL (ref 6.0–8.5)
eGFR: 103 mL/min/{1.73_m2} (ref >59)

## 2021-05-01 LAB — MICROALBUMIN / CREATININE URINE RATIO
Creatinine, Urine: 140 mg/dL
Microalb/Creat Ratio: 6 mg/g creat (ref 0–29)
Microalbumin, Urine: 8 ug/mL

## 2021-05-04 ENCOUNTER — Other Ambulatory Visit: Payer: Self-pay | Admitting: Nurse Practitioner

## 2021-05-04 DIAGNOSIS — I10 Essential (primary) hypertension: Secondary | ICD-10-CM

## 2021-05-05 ENCOUNTER — Encounter: Payer: Self-pay | Admitting: Nurse Practitioner

## 2021-05-06 ENCOUNTER — Other Ambulatory Visit: Payer: Self-pay | Admitting: Nurse Practitioner

## 2021-05-06 ENCOUNTER — Other Ambulatory Visit: Payer: Self-pay

## 2021-05-06 DIAGNOSIS — I10 Essential (primary) hypertension: Secondary | ICD-10-CM

## 2021-05-06 MED ORDER — LISINOPRIL 10 MG PO TABS: 10.0000 mg | ORAL_TABLET | Freq: Every day | ORAL | 1 refills | Status: DC

## 2021-05-30 ENCOUNTER — Other Ambulatory Visit: Payer: Self-pay | Admitting: Nurse Practitioner

## 2021-05-30 DIAGNOSIS — E1165 Type 2 diabetes mellitus with hyperglycemia: Secondary | ICD-10-CM

## 2021-05-31 ENCOUNTER — Other Ambulatory Visit: Payer: Self-pay | Admitting: Nurse Practitioner

## 2021-05-31 DIAGNOSIS — E1169 Type 2 diabetes mellitus with other specified complication: Secondary | ICD-10-CM

## 2021-06-02 NOTE — Telephone Encounter (Signed)
MyChart message sent to call and schedule for visit around 07/28/21 as noted in last OV notes. ?

## 2021-06-02 NOTE — Telephone Encounter (Signed)
Requested Prescriptions  ?Pending Prescriptions Disp Refills  ?? atorvastatin (LIPITOR) 40 MG tablet [Pharmacy Med Name: ATORVASTATIN 40 MG TABLET] 90 tablet 0  ?  Sig: Take 1 tablet (40 mg total) by mouth daily. OFFICE VISIT NEEDED FOR ADDITIONAL REFILLS  ?  ? Cardiovascular:  Antilipid - Statins Failed - 05/31/2021 10:24 AM  ?  ?  Failed - Lipid Panel in normal range within the last 12 months  ?  Cholesterol, Total  ?Date Value Ref Range Status  ?06/19/2020 166 100 - 199 mg/dL Final  ? ?LDL Chol Calc (NIH)  ?Date Value Ref Range Status  ?06/19/2020 103 (H) 0 - 99 mg/dL Final  ? ?HDL  ?Date Value Ref Range Status  ?06/19/2020 41 >39 mg/dL Final  ? ?Triglycerides  ?Date Value Ref Range Status  ?06/19/2020 121 0 - 149 mg/dL Final  ? ?  ?  ?  Passed - Patient is not pregnant  ?  ?  Passed - Valid encounter within last 12 months  ?  Recent Outpatient Visits   ?      ? 1 month ago Type 2 diabetes mellitus with hyperglycemia, without long-term current use of insulin (HCC)  ? [facility] [place], [place] W, NP  ? 6 months ago Type 2 diabetes mellitus with hyperglycemia, without long-term current use of insulin (HCC)  ? [facility] [place], [place] W, NP  ? 11 months ago Type 2 diabetes mellitus without complication, without long-term current use of insulin (HCC)  ? [facility] [place], [place] W, NP  ? 1 year ago Type 2 diabetes mellitus with hyperglycemia, without long-term current use of insulin (HCC)  ? [facility] [place], Cornelius Moras, RPH-CPP  ? 1 year ago Type 2 diabetes mellitus with hyperglycemia, without long-term current use of insulin (HCC)  ? [facility] [place], Cornelius Moras, RPH-CPP  ?  ?  ? ?  ?  ?  ? ?

## 2021-07-08 ENCOUNTER — Other Ambulatory Visit: Payer: Self-pay | Admitting: Nurse Practitioner

## 2021-07-08 DIAGNOSIS — Z1231 Encounter for screening mammogram for malignant neoplasm of breast: Secondary | ICD-10-CM

## 2021-07-25 ENCOUNTER — Other Ambulatory Visit: Payer: Self-pay | Admitting: Nurse Practitioner

## 2021-07-28 NOTE — Telephone Encounter (Signed)
Requested medication (s) are due for refill today: Yes  Requested medication (s) are on the active medication list: No  Last refill:  04/16/21  Future visit scheduled: No  Notes to clinic:  Not on med list.    Requested Prescriptions  Pending Prescriptions Disp Refills   FARXIGA 10 MG TABS tablet [Pharmacy Med Name: FARXIGA 10 MG TABLET] 90 tablet 0    Sig: TAKE 1 TABLET BY [place]     Endocrinology:  Diabetes - SGLT2 Inhibitors Passed - 07/25/2021  7:45 PM      Passed - Cr in normal range and within 360 days    Creatinine, Ser  Date Value Ref Range Status  04/30/2021 0.58 0.57 - 1.00 mg/dL Final         Passed - HBA1C is between 0 and 7.9 and within 180 days    Hemoglobin A1C  Date Value Ref Range Status  04/30/2021 7.8 (A) 4.0 - 5.6 % Final   HbA1c, POC (controlled diabetic range)  Date Value Ref Range Status  05/18/2018 7.6 (A) 0.0 - 7.0 % Final   Hgb A1c MFr Bld  Date Value Ref Range Status  06/19/2020 7.3 (H) 4.8 - 5.6 % Final    Comment:             Prediabetes: 5.7 - 6.4          Diabetes: >6.4          Glycemic control for adults with diabetes: <7.0          Passed - eGFR in normal range and within 360 days    GFR calc Af Amer  Date Value Ref Range Status  10/27/2019 84 >59 mL/min/1.73 Final    Comment:    **[facility] currently reports eGFR in compliance with the current**   recommendations of the [employer]. [facility] will   update reporting as new guidelines are published from the NKF-ASN   Task force.    GFR calc non Af Amer  Date Value Ref Range Status  10/27/2019 73 >59 mL/min/1.73 Final   eGFR  Date Value Ref Range Status  04/30/2021 103 >59 mL/min/1.73 Final         Passed - Valid encounter within last 6 months    Recent Outpatient Visits           2 months ago Type 2 diabetes mellitus with hyperglycemia, without long-term current use of insulin [facility])   [place],  [place] W, NP   8 months ago Type 2 diabetes mellitus with hyperglycemia, without long-term current use of insulin [facility])   [place], [place] W, NP   1 year ago Type 2 diabetes mellitus without complication, without long-term current use of insulin [facility])   [place], [place] W, NP   1 year ago Type 2 diabetes mellitus with hyperglycemia, without long-term current use of insulin [facility])   [place], Annie Main L, RPH-CPP   1 year ago Type 2 diabetes mellitus with hyperglycemia, without long-term current use of insulin [facility])   [place], RPH-CPP

## 2021-08-28 ENCOUNTER — Ambulatory Visit
Admission: RE | Admit: 2021-08-28 | Discharge: 2021-08-28 | Disposition: A | Discharge status: Home / Self Care | Payer: BC Managed Care – PPO | Source: Ambulatory Visit | Attending: Nurse Practitioner | Admitting: Nurse Practitioner

## 2021-08-28 DIAGNOSIS — Z1231 Encounter for screening mammogram for malignant neoplasm of breast: Secondary | ICD-10-CM

## 2021-08-31 ENCOUNTER — Other Ambulatory Visit: Payer: Self-pay | Admitting: Family Medicine

## 2021-08-31 DIAGNOSIS — E1165 Type 2 diabetes mellitus with hyperglycemia: Secondary | ICD-10-CM

## 2021-09-01 ENCOUNTER — Other Ambulatory Visit: Payer: Self-pay | Admitting: Nurse Practitioner

## 2021-09-01 DIAGNOSIS — E1169 Type 2 diabetes mellitus with other specified complication: Secondary | ICD-10-CM

## 2021-09-30 ENCOUNTER — Telehealth: Payer: Self-pay | Admitting: Emergency Medicine

## 2021-09-30 NOTE — Telephone Encounter (Signed)
Needs physical form filled out for work purposes.

## 2021-10-09 NOTE — Telephone Encounter (Signed)
Paperwork was put in providers box for fax

## 2021-10-16 NOTE — Telephone Encounter (Signed)
Pt daughter called to follow up on forms. Daughter mentioned pt received a call about this but call dropped and just wants to know if the forms are ready for pick up.    Please advise.

## 2021-10-17 NOTE — Telephone Encounter (Signed)
Daughter was called and she was informed to bring patient in office one day next week for a nurse visit to get her vision checked. TB test has already been done

## 2021-10-27 ENCOUNTER — Other Ambulatory Visit: Payer: Self-pay | Admitting: Family Medicine

## 2021-10-27 ENCOUNTER — Other Ambulatory Visit: Payer: Self-pay | Admitting: Nurse Practitioner

## 2021-10-27 ENCOUNTER — Ambulatory Visit: Payer: BC Managed Care – PPO | Attending: Nurse Practitioner

## 2021-10-27 DIAGNOSIS — I10 Essential (primary) hypertension: Secondary | ICD-10-CM

## 2021-10-28 NOTE — Telephone Encounter (Signed)
Requested Prescriptions  Pending Prescriptions Disp Refills  . lisinopril (ZESTRIL) 10 MG tablet [Pharmacy Med Name: LISINOPRIL 10 MG TABLET] 90 tablet 0    Sig: TAKE 1 TABLET BY MOUTH EVERY DAY     Cardiovascular:  ACE Inhibitors Passed - 10/27/2021  1:59 AM      Passed - Cr in normal range and within 180 days    Creatinine, Ser  Date Value Ref Range Status  04/30/2021 0.58 0.57 - 1.00 mg/dL Final         Passed - K in normal range and within 180 days    Potassium  Date Value Ref Range Status  04/30/2021 4.6 3.5 - 5.2 mmol/L Final         Passed - Patient is not pregnant      Passed - Last BP in normal range    BP Readings from Last 1 Encounters:  04/30/21 108/73         Passed - Valid encounter within last 6 months    Recent Outpatient Visits          6 months ago Type 2 diabetes mellitus with hyperglycemia, without long-term current use of insulin (HCC)   [facility] [place], [place] W, NP   11 months ago Type 2 diabetes mellitus with hyperglycemia, without long-term current use of insulin [facility])   [place] [facility] [place], [place] W, NP   1 year ago Type 2 diabetes mellitus without complication, without long-term current use of insulin [facility])   [place] [facility] [place], [place] W, NP   1 year ago Type 2 diabetes mellitus with hyperglycemia, without long-term current use of insulin [facility])    [facility] [place], Cornelius Moras, RPH-CPP   1 year ago Type 2 diabetes mellitus with hyperglycemia, without long-term current use of insulin [facility])   [facility] Lois Huxley, Cornelius Moras, RPH-CPP      Future Appointments            In 1 month Claiborne Rigg, NP [facility] And Wellness

## 2021-10-28 NOTE — Telephone Encounter (Signed)
Requested Prescriptions  Pending Prescriptions Disp Refills  . FARXIGA 10 MG TABS tablet [Pharmacy Med Name: FARXIGA 10 MG TABLET] 90 tablet 0    Sig: TAKE 1 TABLET BY MOUTH EVERY DAY BEFORE BREAKFAST     Endocrinology:  Diabetes - SGLT2 Inhibitors Passed - 10/27/2021  1:59 AM      Passed - Cr in normal range and within 360 days    Creatinine, Ser  Date Value Ref Range Status  04/30/2021 0.58 0.57 - 1.00 mg/dL Final         Passed - HBA1C is between 0 and 7.9 and within 180 days    Hemoglobin A1C  Date Value Ref Range Status  04/30/2021 7.8 (A) 4.0 - 5.6 % Final   HbA1c, POC (controlled diabetic range)  Date Value Ref Range Status  05/18/2018 7.6 (A) 0.0 - 7.0 % Final   Hgb A1c MFr Bld  Date Value Ref Range Status  06/19/2020 7.3 (H) 4.8 - 5.6 % Final    Comment:             Prediabetes: 5.7 - 6.4          Diabetes: >6.4          Glycemic control for adults with diabetes: <7.0          Passed - eGFR in normal range and within 360 days    GFR calc Af Amer  Date Value Ref Range Status  10/27/2019 84 >59 mL/min/1.73 Final    Comment:    **[facility] currently reports eGFR in compliance with the current**   recommendations of the [employer]. [facility] will   update reporting as new guidelines are published from the NKF-ASN   Task force.    GFR calc non Af Amer  Date Value Ref Range Status  10/27/2019 73 >59 mL/min/1.73 Final   eGFR  Date Value Ref Range Status  04/30/2021 103 >59 mL/min/1.73 Final         Passed - Valid encounter within last 6 months    Recent Outpatient Visits          6 months ago Type 2 diabetes mellitus with hyperglycemia, without long-term current use of insulin [facility])   [place], [place] W, NP   11 months ago Type 2 diabetes mellitus with hyperglycemia, without long-term current use of insulin [facility])   [place], [place] W, NP   1 year ago Type 2 diabetes  mellitus without complication, without long-term current use of insulin [facility])   [place], [place] W, NP   1 year ago Type 2 diabetes mellitus with hyperglycemia, without long-term current use of insulin [facility])   [place], Jarome Matin, RPH-CPP   1 year ago Type 2 diabetes mellitus with hyperglycemia, without long-term current use of insulin [facility])   [place], RPH-CPP      Future Appointments            In 1 month Gildardo Pounds, NP [place]

## 2021-12-04 ENCOUNTER — Other Ambulatory Visit: Payer: Self-pay | Admitting: Family Medicine

## 2021-12-04 ENCOUNTER — Other Ambulatory Visit: Payer: Self-pay | Admitting: Nurse Practitioner

## 2021-12-04 DIAGNOSIS — E1169 Type 2 diabetes mellitus with other specified complication: Secondary | ICD-10-CM

## 2021-12-04 DIAGNOSIS — E1165 Type 2 diabetes mellitus with hyperglycemia: Secondary | ICD-10-CM

## 2021-12-04 NOTE — Telephone Encounter (Signed)
Requested Prescriptions  Pending Prescriptions Disp Refills  . glimepiride (AMARYL) 4 MG tablet [Pharmacy Med Name: GLIMEPIRIDE 4 MG TABLET] 180 tablet 0    Sig: TAKE 2 TABLETS BY MOUTH DAILY WITH BREAKFAST.     Endocrinology:  Diabetes - Sulfonylureas Failed - 12/04/2021  1:55 PM      Failed - HBA1C is between 0 and 7.9 and within 180 days    Hemoglobin A1C  Date Value Ref Range Status  04/30/2021 7.8 (A) 4.0 - 5.6 % Final   HbA1c, POC (controlled diabetic range)  Date Value Ref Range Status  05/18/2018 7.6 (A) 0.0 - 7.0 % Final   Hgb A1c MFr Bld  Date Value Ref Range Status  06/19/2020 7.3 (H) 4.8 - 5.6 % Final    Comment:             Prediabetes: 5.7 - 6.4          Diabetes: >6.4          Glycemic control for adults with diabetes: <7.0          Failed - Valid encounter within last 6 months    Recent Outpatient Visits          7 months ago Type 2 diabetes mellitus with hyperglycemia, without long-term current use of insulin [facility])   [place], [place] W, NP   1 year ago Type 2 diabetes mellitus with hyperglycemia, without long-term current use of insulin [facility])   [place], [place] W, NP   1 year ago Type 2 diabetes mellitus without complication, without long-term current use of insulin [facility])   [place], [place] W, NP   1 year ago Type 2 diabetes mellitus with hyperglycemia, without long-term current use of insulin [facility])   [place], Annie Main L, RPH-CPP   1 year ago Type 2 diabetes mellitus with hyperglycemia, without long-term current use of insulin [facility])   [place], RPH-CPP      Future Appointments            In 1 week Gildardo Pounds, NP [place] - Cr in normal range and within 360 days    Creatinine, Ser   Date Value Ref Range Status  04/30/2021 0.58 0.57 - 1.00 mg/dL Final

## 2021-12-04 NOTE — Telephone Encounter (Signed)
Requested medications are due for refill today.  yes  Requested medications are on the active medications list.  yes  Last refill. 09/02/2021 #90 0 rf  Future visit scheduled.   yes  Notes to clinic.  Labs are expired.    Requested Prescriptions  Pending Prescriptions Disp Refills   atorvastatin (LIPITOR) 40 MG tablet [Pharmacy Med Name: ATORVASTATIN 40 MG TABLET] 90 tablet 0    Sig: TAKE 1 TABLET BY MOUTH DAILY *NEED APPOINTMENT WITH PCP FOR MORE REFILLS*     Cardiovascular:  Antilipid - Statins Failed - 12/04/2021  1:55 PM      Failed - Lipid Panel in normal range within the last 12 months    Cholesterol, Total  Date Value Ref Range Status  06/19/2020 166 100 - 199 mg/dL Final   LDL Chol Calc (NIH)  Date Value Ref Range Status  06/19/2020 103 (H) 0 - 99 mg/dL Final   HDL  Date Value Ref Range Status  06/19/2020 41 >39 mg/dL Final   Triglycerides  Date Value Ref Range Status  06/19/2020 121 0 - 149 mg/dL Final         Passed - Patient is not pregnant      Passed - Valid encounter within last 12 months    Recent Outpatient Visits           7 months ago Type 2 diabetes mellitus with hyperglycemia, without long-term current use of insulin ([place])   [place], [place] W, NP   1 year ago Type 2 diabetes mellitus with hyperglycemia, without long-term current use of insulin [facility])   [place], [place] W, NP   1 year ago Type 2 diabetes mellitus without complication, without long-term current use of insulin [facility])   [place], [place] W, NP   1 year ago Type 2 diabetes mellitus with hyperglycemia, without long-term current use of insulin [facility])   [place], Jarome Matin, RPH-CPP   1 year ago Type 2 diabetes mellitus with hyperglycemia, without long-term current use of insulin [facility])   [place], RPH-CPP       Future Appointments             In 1 week Gildardo Pounds, NP [place]

## 2021-12-15 ENCOUNTER — Ambulatory Visit: Payer: BC Managed Care – PPO | Admitting: Nurse Practitioner

## 2021-12-31 ENCOUNTER — Other Ambulatory Visit: Payer: Self-pay | Admitting: Family Medicine

## 2021-12-31 DIAGNOSIS — E1169 Type 2 diabetes mellitus with other specified complication: Secondary | ICD-10-CM

## 2022-01-14 ENCOUNTER — Other Ambulatory Visit: Payer: Self-pay | Admitting: Family Medicine

## 2022-01-14 DIAGNOSIS — E1169 Type 2 diabetes mellitus with other specified complication: Secondary | ICD-10-CM

## 2022-01-15 ENCOUNTER — Encounter: Payer: Self-pay | Admitting: Physician Assistant

## 2022-01-15 ENCOUNTER — Ambulatory Visit: Payer: BC Managed Care – PPO | Attending: Nurse Practitioner | Admitting: Physician Assistant

## 2022-01-15 VITALS — BP 149/86 | HR 83 | Ht 71.0 in | Wt 239.6 lb

## 2022-01-15 DIAGNOSIS — M791 Myalgia, unspecified site: Secondary | ICD-10-CM | POA: Diagnosis not present

## 2022-01-15 DIAGNOSIS — E1169 Type 2 diabetes mellitus with other specified complication: Secondary | ICD-10-CM

## 2022-01-15 DIAGNOSIS — I10 Essential (primary) hypertension: Secondary | ICD-10-CM | POA: Diagnosis not present

## 2022-01-15 DIAGNOSIS — E782 Mixed hyperlipidemia: Secondary | ICD-10-CM

## 2022-01-15 DIAGNOSIS — E1165 Type 2 diabetes mellitus with hyperglycemia: Secondary | ICD-10-CM | POA: Diagnosis not present

## 2022-01-15 LAB — POCT GLYCOSYLATED HEMOGLOBIN (HGB A1C): HbA1c, POC (controlled diabetic range): 9.2 % — AB (ref 0.0–7.0)

## 2022-01-15 LAB — GLUCOSE, POCT (MANUAL RESULT ENTRY): POC Glucose: 324 mg/dl — AB (ref 70–99)

## 2022-01-15 MED ORDER — MELOXICAM 7.5 MG PO TABS: 7.5000 mg | ORAL_TABLET | Freq: Every day | ORAL | 3 refills | Status: DC

## 2022-01-15 MED ORDER — DAPAGLIFLOZIN PROPANEDIOL 10 MG PO TABS: ORAL_TABLET | ORAL | 1 refills | Status: DC

## 2022-01-15 MED ORDER — GLIMEPIRIDE 4 MG PO TABS: 8.0000 mg | ORAL_TABLET | Freq: Every day | ORAL | 1 refills | Status: DC

## 2022-01-15 MED ORDER — ATORVASTATIN CALCIUM 40 MG PO TABS: 40.0000 mg | ORAL_TABLET | Freq: Every day | ORAL | 1 refills | Status: DC

## 2022-01-15 MED ORDER — DICLOFENAC SODIUM 1 % EX GEL: 2.0000 g | Freq: Four times a day (QID) | CUTANEOUS | 1 refills | Status: DC

## 2022-01-15 MED ORDER — LISINOPRIL 10 MG PO TABS: 10.0000 mg | ORAL_TABLET | Freq: Every day | ORAL | 0 refills | Status: DC

## 2022-01-15 MED ORDER — METFORMIN HCL 500 MG PO TABS: 500.0000 mg | ORAL_TABLET | Freq: Two times a day (BID) | ORAL | 3 refills | Status: DC

## 2022-01-15 NOTE — Patient Instructions (Addendum)
Drink 80-100 ounces water daily. Eliminate sugar from your diet.  Add more exercise

## 2022-01-15 NOTE — Progress Notes (Signed)
Christina Fields, is a 61 y.o. female  ZYS:[id number]  XNA:[id number]  DOB - 1960-12-03  Chief Complaint  Patient presents with   Diabetes       Subjective:   Christina Fields is a 61 y.o. female here today for med RF.  No new issues or concerns.  She has not been getting as much exercise and feels she has not been doing good with her diet since stopping working a few months ago.  Her daughter is with her translating but patient speaks and understands a lot of english too.     No problems updated.  ALLERGIES: No Known Allergies  PAST MEDICAL HISTORY: Past Medical History:  Diagnosis Date   Diabetes mellitus without complication (HCC)    Hyperlipidemia    Hypertension     MEDICATIONS AT HOME: Prior to Admission medications   Medication Sig Start Date End Date Taking? Authorizing Provider  Accu-Chek Softclix Lancets lancets Check blood glucose level by fingerstick twice per day. E11.65 05/01/20  Yes Hoy Register, MD  cyclobenzaprine (FLEXERIL) 5 MG tablet Take 1 tablet (5 mg total) by mouth at bedtime as needed for muscle spasms. 04/30/21  Yes Claiborne Rigg, NP  glucose blood (ACCU-CHEK GUIDE) test strip USE TO CHECK BLOOD GLUCOSE BY FINGERSTICK TWICE A DAY 12/11/20  Yes Claiborne Rigg, NP  Lancet Devices (MICROLET NEXT LANCING DEVICE) MISC 1 Device by Does not apply route daily. 07/25/19  Yes Claiborne Rigg, NP  metFORMIN (GLUCOPHAGE) 500 MG tablet Take 1 tablet (500 mg total) by mouth 2 (two) times daily with a meal. 01/15/22  Yes Tavaras Goody M, PA-C  Vitamin D, Ergocalciferol, (DRISDOL) 1.25 MG (50000 UNIT) CAPS capsule Take 1 capsule (50,000 Units total) by mouth every 7 (seven) days. 04/04/19  Yes Claiborne Rigg, NP  atorvastatin (LIPITOR) 40 MG tablet Take 1 tablet (40 mg total) by mouth daily. 01/15/22   Anders Simmonds, PA-C  dapagliflozin propanediol (FARXIGA) 10 MG TABS tablet TAKE 1 TABLET BY MOUTH EVERY DAY BEFORE BREAKFAST 01/15/22   Georgian Co M, PA-C   diclofenac Sodium (VOLTAREN) 1 % GEL Apply 2 g topically 4 (four) times daily. 01/15/22   Anders Simmonds, PA-C  glimepiride (AMARYL) 4 MG tablet Take 2 tablets (8 mg total) by mouth daily with breakfast. 01/15/22   Anders Simmonds, PA-C  lisinopril (ZESTRIL) 10 MG tablet Take 1 tablet (10 mg total) by mouth daily. 01/15/22   Anders Simmonds, PA-C  meloxicam (MOBIC) 7.5 MG tablet Take 1 tablet (7.5 mg total) by mouth daily. Prn pain 01/15/22   Tonye Tancredi, Marzella Schlein, PA-C    ROS: Neg HEENT Neg resp Neg cardiac Neg GI Neg GU Neg MS Neg psych Neg neuro  Objective:   Vitals:   01/15/22 1526  BP: (!) 149/86  Pulse: 83  SpO2: 94%  Weight: 239 lb 9.6 oz (108.7 kg)  Height: 5\' 11"  (1.803 m)   Exam General appearance : Awake, alert, not in any distress. Speech Clear. Not toxic looking HEENT: Atraumatic and Normocephalic, pupils equally reactive to light and accomodation Neck: Supple, no JVD. No cervical lymphadenopathy.  Chest: Good air entry bilaterally, CTAB.  No rales/rhonchi/wheezing CVS: S1 S2 regular, no murmurs.  Abdomen: Bowel sounds present, Non tender and not distended with no gaurding, rigidity or rebound. Extremities: B/L Lower Ext shows no edema, both legs are warm to touch Neurology: Awake alert, and oriented X 3, CN II-XII intact, Non focal Skin: No Rash  Data Review  Lab Results  Component Value Date   HGBA1C 9.2 (A) 01/15/2022   HGBA1C 7.8 (A) 04/30/2021   HGBA1C 8.6 (A) 11/18/2020    Assessment & Plan   1. Type 2 diabetes mellitus with hyperglycemia, without long-term current use of insulin (HCC) Uncontrolled-she will work on improving her exercise and diet and in the mean time, I will add back metformin bc she tolerated it ok in the past - Glucose (CBG) - HgB A1c - glimepiride (AMARYL) 4 MG tablet; Take 2 tablets (8 mg total) by mouth daily with breakfast.  Dispense: 180 tablet; Refill: 1 - dapagliflozin propanediol (FARXIGA) 10 MG TABS tablet; TAKE 1  TABLET BY MOUTH EVERY DAY BEFORE BREAKFAST  Dispense: 270 tablet; Refill: 1 - metFORMIN (GLUCOPHAGE) 500 MG tablet; Take 1 tablet (500 mg total) by mouth 2 (two) times daily with a meal.  Dispense: 180 tablet; Refill: 3 (pending GFR) - Comprehensive metabolic panel  2. Myalgia - meloxicam (MOBIC) 7.5 MG tablet; Take 1 tablet (7.5 mg total) by mouth daily. Prn pain  Dispense: 30 tablet; Refill: 3 - diclofenac Sodium (VOLTAREN) 1 % GEL; Apply 2 g topically 4 (four) times daily.  Dispense: 100 g; Refill: 1  3. Essential hypertension She had not taken this today.  Check BP OOO and record and if not <130/85 average after 3 weeks, please schedule another appt.  Patient verbalized understanding - lisinopril (ZESTRIL) 10 MG tablet; Take 1 tablet (10 mg total) by mouth daily.  Dispense: 90 tablet; Refill: 0 - Comprehensive metabolic panel  4. Combined hyperlipidemia associated with type 2 diabetes mellitus (HCC) - atorvastatin (LIPITOR) 40 MG tablet; Take 1 tablet (40 mg total) by mouth daily.  Dispense: 90 tablet; Refill: 1    Return in about 3 months (around 04/17/2022) for PCP for chronic conditions.  The patient was given clear instructions to go to ER or return to medical center if symptoms don't improve, worsen or new problems develop. The patient verbalized understanding. The patient was told to call to get lab results if they haven't heard anything in the next week.      Georgian Co, PA-C [facility] [place], [place] [phone]   01/15/2022, 4:42 PMPatient ID: Christina Fields, female   DOB: 11/14/1960, 61 y.o.   MRN: [id number]

## 2022-01-16 LAB — COMPREHENSIVE METABOLIC PANEL
ALT: 11 IU/L (ref 0–32)
AST: 10 IU/L (ref 0–40)
Albumin/Globulin Ratio: 1.4 (ref 1.2–2.2)
Albumin: 4.2 g/dL (ref 3.9–4.9)
Alkaline Phosphatase: 91 IU/L (ref 44–121)
BUN/Creatinine Ratio: 19 (ref 12–28)
BUN: 13 mg/dL (ref 8–27)
Bilirubin Total: <0.2 mg/dL (ref 0.0–1.2)
CO2: 23 mmol/L (ref 20–29)
Calcium: 9.6 mg/dL (ref 8.7–10.3)
Chloride: 100 mmol/L (ref 96–106)
Creatinine, Ser: 0.7 mg/dL (ref 0.57–1.00)
Globulin, Total: 2.9 g/dL (ref 1.5–4.5)
Glucose: 338 mg/dL — ABNORMAL HIGH (ref 70–99)
Potassium: 4.2 mmol/L (ref 3.5–5.2)
Sodium: 135 mmol/L (ref 134–144)
Total Protein: 7.1 g/dL (ref 6.0–8.5)
eGFR: 98 mL/min/{1.73_m2} (ref >59)

## 2022-04-13 ENCOUNTER — Encounter: Payer: Self-pay | Admitting: Nurse Practitioner

## 2022-04-13 ENCOUNTER — Ambulatory Visit: Payer: BC Managed Care – PPO | Attending: Nurse Practitioner | Admitting: Nurse Practitioner

## 2022-04-13 VITALS — BP 139/84 | HR 65 | Ht 69.0 in | Wt 236.2 lb

## 2022-04-13 DIAGNOSIS — E1169 Type 2 diabetes mellitus with other specified complication: Secondary | ICD-10-CM

## 2022-04-13 DIAGNOSIS — E782 Mixed hyperlipidemia: Secondary | ICD-10-CM

## 2022-04-13 DIAGNOSIS — Z7984 Long term (current) use of oral hypoglycemic drugs: Secondary | ICD-10-CM

## 2022-04-13 DIAGNOSIS — E559 Vitamin D deficiency, unspecified: Secondary | ICD-10-CM | POA: Diagnosis not present

## 2022-04-13 DIAGNOSIS — Z23 Encounter for immunization: Secondary | ICD-10-CM

## 2022-04-13 DIAGNOSIS — M791 Myalgia, unspecified site: Secondary | ICD-10-CM

## 2022-04-13 DIAGNOSIS — E1165 Type 2 diabetes mellitus with hyperglycemia: Secondary | ICD-10-CM

## 2022-04-13 DIAGNOSIS — I10 Essential (primary) hypertension: Secondary | ICD-10-CM | POA: Diagnosis not present

## 2022-04-13 NOTE — Progress Notes (Unsigned)
Assessment & Plan:  Christina Fields was seen today for hypertension and heartburn.  Diagnoses and all orders for this visit:  Primary hypertension Continue lisinopril as prescribed.  Reminded to bring in blood pressure log for follow  up appointment.  RECOMMENDATIONS: DASH/Mediterranean Diets are healthier choices for HTN.    Vitamin D deficiency disease -     VITAMIN D 25 Hydroxy (Vit-D Deficiency, Fractures)  Type 2 diabetes mellitus with hyperglycemia, without long-term current use of insulin (HCC) -     Basic metabolic panel Continue blood sugar control as discussed in office today, low carbohydrate diet, and regular physical exercise as tolerated, 150 minutes per week (30 min each day, 5 days per week, or 50 min 3 days per week). Keep blood sugar logs with fasting goal of 90-130 mg/dl, post prandial (after you eat) less than 180.  For Hypoglycemia: BS <60 and Hyperglycemia BS >400; contact the clinic ASAP. Annual eye exams and foot exams are recommended.   Myalgia -     CBC with Differential  Need for shingles vaccine -     Varicella-zoster vaccine IM  Need for Tdap vaccination -     Tdap vaccine greater than or equal to 7yo IM  Combined hyperlipidemia associated with type 2 diabetes mellitus INSTRUCTIONS: Work on a low fat, heart healthy diet and participate in regular aerobic exercise program by working out at least 150 minutes per week; 5 days a week-30 minutes per day. Avoid red meat/beef/steak,  fried foods. junk foods, sodas, sugary drinks, unhealthy snacking, alcohol and smoking.  Drink at least 80 oz of water per day and monitor your carbohydrate intake daily.     Patient has been counseled on age-appropriate routine health concerns for screening and prevention. These are reviewed and up-to-date. Referrals have been placed accordingly. Immunizations are up-to-date or declined.    Subjective:   Chief Complaint  Patient presents with   Hypertension   Heartburn   HPI Christina Fields 62 y.o. female presents to office today  for follow-up to diabetes and hypertension.  She is accompanied by her daughter today.  She has a past medical history of DM2,  Hyperlipidemia, and Hypertension.   HTN Blood pressure improved today however not quite at goal.  Weight has been fluctuating.  Currently taking lisinopril 10 mg daily BP Readings from Last 3 Encounters:  04/13/22 139/84  01/15/22 (!) 149/86  04/30/21 108/73    DM 2 Not much improvement in A1c and currently down to 8.8 from 9.2.  Unfortunately her insurance plan does not fully cover Victoza, Trulicity or Ozempic.  Cost was over $200 for a 90-day supply.  She is currently taking Farxiga 10 mg daily metformin 500 mg twice daily and glimepiride 8 mg daily.  She cannot tolerate any increased dosage of metformin and we are maxed out on her glimepiride and [place].  I would like her to make significant dietary changes over the next few months and if unable we will need to start her on nighttime insulin. Lab Results  Component Value Date   HGBA1C 8.8 (A) 04/14/2022  LDL not at goal with high intensity statin Lipitor.   Lab Results  Component Value Date   LDLCALC 103 (H) 06/19/2020       Review of Systems  Constitutional:  Negative for fever, malaise/fatigue and weight loss.  HENT: Negative.  Negative for nosebleeds.   Eyes: Negative.  Negative for blurred vision, double vision and photophobia.  Respiratory: Negative.  Negative  for cough and shortness of breath.   Cardiovascular: Negative.  Negative for chest pain, palpitations and leg swelling.  Gastrointestinal: Negative.  Negative for heartburn, nausea and vomiting.  Musculoskeletal: Negative.  Negative for myalgias.  Neurological: Negative.  Negative for dizziness, focal weakness, seizures and headaches.  Psychiatric/Behavioral: Negative.  Negative for suicidal ideas.     Past Medical History:  Diagnosis Date   Diabetes mellitus without  complication ([facility])    Hyperlipidemia    Hypertension     Past Surgical History:  Procedure Laterality Date   ANKLE SURGERY  2004   CESAREAN SECTION     x2    Family History  Problem Relation Age of Onset   Diabetes Mother     Social History Reviewed with no changes to be made today.   Outpatient Medications Prior to Visit  Medication Sig Dispense Refill   Accu-Chek Softclix Lancets lancets Check blood glucose level by fingerstick twice per day. E11.65 100 each 2   atorvastatin (LIPITOR) 40 MG tablet Take 1 tablet (40 mg total) by mouth daily. 90 tablet 1   cyclobenzaprine (FLEXERIL) 5 MG tablet Take 1 tablet (5 mg total) by mouth at bedtime as needed for muscle spasms. 30 tablet 3   dapagliflozin propanediol (FARXIGA) 10 MG TABS tablet TAKE 1 TABLET BY MOUTH EVERY DAY BEFORE BREAKFAST 270 tablet 1   diclofenac Sodium (VOLTAREN) 1 % GEL Apply 2 g topically 4 (four) times daily. 100 g 1   glimepiride (AMARYL) 4 MG tablet Take 2 tablets (8 mg total) by mouth daily with breakfast. 180 tablet 1   glucose blood (ACCU-CHEK GUIDE) test strip USE TO CHECK BLOOD GLUCOSE BY FINGERSTICK TWICE A DAY 200 strip 1   Lancet Devices (MICROLET NEXT LANCING DEVICE) MISC 1 Device by Does not apply route daily. 1 each 0   meloxicam (MOBIC) 7.5 MG tablet Take 1 tablet (7.5 mg total) by mouth daily. Prn pain 30 tablet 3   metFORMIN (GLUCOPHAGE) 500 MG tablet Take 1 tablet (500 mg total) by mouth 2 (two) times daily with a meal. 180 tablet 3   lisinopril (ZESTRIL) 10 MG tablet Take 1 tablet (10 mg total) by mouth daily. 90 tablet 0   Vitamin D, Ergocalciferol, (DRISDOL) 1.25 MG (50000 UNIT) CAPS capsule Take 1 capsule (50,000 Units total) by mouth every 7 (seven) days. (Patient not taking: Reported on 04/13/2022) 12 capsule 0   No facility-administered medications prior to visit.    No Known Allergies     Objective:    BP 139/84   Pulse 65   Ht 5\' 9"  (1.753 m)   Wt 236 lb 3.2 oz (107.1 kg)   LMP   (LMP Unknown)   SpO2 98%   BMI 34.88 kg/m  Wt Readings from Last 3 Encounters:  04/13/22 236 lb 3.2 oz (107.1 kg)  01/15/22 239 lb 9.6 oz (108.7 kg)  04/30/21 232 lb 8 oz (105.5 kg)    Physical Exam Vitals and nursing note reviewed.  Constitutional:      Appearance: She is well-developed.  HENT:     Head: Normocephalic and atraumatic.  Cardiovascular:     Rate and Rhythm: Normal rate and regular rhythm.     Heart sounds: Normal heart sounds. No murmur heard.    No friction rub. No gallop.  Pulmonary:     Effort: Pulmonary effort is normal. No tachypnea or respiratory distress.     Breath sounds: Normal breath sounds. No decreased breath sounds, wheezing, rhonchi or  rales.  Chest:     Chest wall: No tenderness.  Abdominal:     General: Bowel sounds are normal.     Palpations: Abdomen is soft.  Musculoskeletal:        General: Normal range of motion.     Cervical back: Normal range of motion.  Skin:    General: Skin is warm and dry.  Neurological:     Mental Status: She is alert and oriented to person, place, and time.     Coordination: Coordination normal.  Psychiatric:        Behavior: Behavior normal. Behavior is cooperative.        Thought Content: Thought content normal.        Judgment: Judgment normal.          Patient has been counseled extensively about nutrition and exercise as well as the importance of adherence with medications and regular follow-up. The patient was given clear instructions to go to ER or return to medical center if symptoms don't improve, worsen or new problems develop. The patient verbalized understanding.   Follow-up: No follow-ups on file.   Gildardo Pounds, FNP-BC [facility], [place], [place]   04/14/2022, 9:00 AM

## 2022-04-13 NOTE — Progress Notes (Unsigned)
Christina Fields- daughter translating

## 2022-04-14 ENCOUNTER — Encounter: Payer: Self-pay | Admitting: Nurse Practitioner

## 2022-04-14 ENCOUNTER — Other Ambulatory Visit: Payer: Self-pay | Admitting: Nurse Practitioner

## 2022-04-14 DIAGNOSIS — Z124 Encounter for screening for malignant neoplasm of cervix: Secondary | ICD-10-CM

## 2022-04-14 LAB — POCT GLYCOSYLATED HEMOGLOBIN (HGB A1C): Hemoglobin A1C: 8.8 % — AB (ref 4.0–5.6)

## 2022-04-14 LAB — CBC WITH DIFFERENTIAL/PLATELET
Basophils Absolute: 0.1 10*3/uL (ref 0.0–0.2)
Basos: 1 %
EOS (ABSOLUTE): 0.9 10*3/uL — ABNORMAL HIGH (ref 0.0–0.4)
Eos: 12 %
Hematocrit: 44.4 % (ref 34.0–46.6)
Hemoglobin: 14.7 g/dL (ref 11.1–15.9)
Immature Grans (Abs): 0 10*3/uL (ref 0.0–0.1)
Immature Granulocytes: 0 %
Lymphocytes Absolute: 2.1 10*3/uL (ref 0.7–3.1)
Lymphs: 27 %
MCH: 29.1 pg (ref 26.6–33.0)
MCHC: 33.1 g/dL (ref 31.5–35.7)
MCV: 88 fL (ref 79–97)
Monocytes Absolute: 0.3 10*3/uL (ref 0.1–0.9)
Monocytes: 4 %
Neutrophils Absolute: 4.3 10*3/uL (ref 1.4–7.0)
Neutrophils: 56 %
Platelets: 237 10*3/uL (ref 150–450)
RBC: 5.06 x10E6/uL (ref 3.77–5.28)
RDW: 13.7 % (ref 11.7–15.4)
WBC: 7.7 10*3/uL (ref 3.4–10.8)

## 2022-04-14 LAB — VITAMIN D 25 HYDROXY (VIT D DEFICIENCY, FRACTURES): Vit D, 25-Hydroxy: 17.4 ng/mL — ABNORMAL LOW (ref 30.0–100.0)

## 2022-04-14 LAB — BASIC METABOLIC PANEL
BUN/Creatinine Ratio: 26 (ref 12–28)
BUN: 17 mg/dL (ref 8–27)
CO2: 22 mmol/L (ref 20–29)
Calcium: 9.6 mg/dL (ref 8.7–10.3)
Chloride: 101 mmol/L (ref 96–106)
Creatinine, Ser: 0.65 mg/dL (ref 0.57–1.00)
Glucose: 148 mg/dL — ABNORMAL HIGH (ref 70–99)
Potassium: 4.4 mmol/L (ref 3.5–5.2)
Sodium: 138 mmol/L (ref 134–144)
eGFR: 100 mL/min/{1.73_m2} (ref >59)

## 2022-04-14 MED ORDER — VITAMIN D (ERGOCALCIFEROL) 1.25 MG (50000 UNIT) PO CAPS: 50000.0000 [IU] | ORAL_CAPSULE | ORAL | 0 refills | Status: DC

## 2022-04-14 MED ORDER — LISINOPRIL 10 MG PO TABS: 10.0000 mg | ORAL_TABLET | Freq: Every day | ORAL | 1 refills | Status: DC

## 2022-04-18 ENCOUNTER — Encounter: Payer: Self-pay | Admitting: Nurse Practitioner

## 2022-04-19 ENCOUNTER — Other Ambulatory Visit: Payer: Self-pay | Admitting: Nurse Practitioner

## 2022-04-19 DIAGNOSIS — E119 Type 2 diabetes mellitus without complications: Secondary | ICD-10-CM

## 2022-04-19 MED ORDER — OZEMPIC (0.25 OR 0.5 MG/DOSE) 2 MG/3ML ~~LOC~~ SOPN: 0.2500 mg | PEN_INJECTOR | SUBCUTANEOUS | 0 refills | Status: DC

## 2022-04-19 MED ORDER — OZEMPIC (0.25 OR 0.5 MG/DOSE) 2 MG/3ML ~~LOC~~ SOPN: 0.2500 mg | PEN_INJECTOR | SUBCUTANEOUS | 1 refills | Status: DC

## 2022-05-05 ENCOUNTER — Other Ambulatory Visit: Payer: Self-pay | Admitting: Nurse Practitioner

## 2022-05-05 ENCOUNTER — Other Ambulatory Visit: Payer: Self-pay

## 2022-05-05 DIAGNOSIS — E119 Type 2 diabetes mellitus without complications: Secondary | ICD-10-CM

## 2022-05-05 MED ORDER — OZEMPIC (0.25 OR 0.5 MG/DOSE) 2 MG/3ML ~~LOC~~ SOPN
0.2500 mg | PEN_INJECTOR | SUBCUTANEOUS | 0 refills | Status: DC
  Filled 2022-05-05: qty 3, 28d supply, fill #0

## 2022-07-01 ENCOUNTER — Other Ambulatory Visit: Payer: Self-pay | Admitting: Nurse Practitioner

## 2022-07-01 DIAGNOSIS — E559 Vitamin D deficiency, unspecified: Secondary | ICD-10-CM

## 2022-07-02 ENCOUNTER — Other Ambulatory Visit: Payer: Self-pay | Admitting: Nurse Practitioner

## 2022-07-02 DIAGNOSIS — E559 Vitamin D deficiency, unspecified: Secondary | ICD-10-CM

## 2022-07-02 MED ORDER — VITAMIN D (ERGOCALCIFEROL) 1.25 MG (50000 UNIT) PO CAPS: 50000.0000 [IU] | ORAL_CAPSULE | ORAL | 0 refills | Status: DC

## 2022-07-13 ENCOUNTER — Ambulatory Visit: Payer: BC Managed Care – PPO | Attending: Nurse Practitioner | Admitting: Nurse Practitioner

## 2022-07-13 ENCOUNTER — Encounter: Payer: Self-pay | Admitting: Nurse Practitioner

## 2022-07-13 VITALS — BP 132/81 | HR 54 | Resp 16 | Ht 69.0 in | Wt 231.6 lb

## 2022-07-13 DIAGNOSIS — M25561 Pain in right knee: Secondary | ICD-10-CM

## 2022-07-13 DIAGNOSIS — E1169 Type 2 diabetes mellitus with other specified complication: Secondary | ICD-10-CM | POA: Diagnosis not present

## 2022-07-13 DIAGNOSIS — E559 Vitamin D deficiency, unspecified: Secondary | ICD-10-CM | POA: Diagnosis not present

## 2022-07-13 DIAGNOSIS — E1165 Type 2 diabetes mellitus with hyperglycemia: Secondary | ICD-10-CM | POA: Diagnosis not present

## 2022-07-13 DIAGNOSIS — I10 Essential (primary) hypertension: Secondary | ICD-10-CM

## 2022-07-13 DIAGNOSIS — G8929 Other chronic pain: Secondary | ICD-10-CM

## 2022-07-13 DIAGNOSIS — R7989 Other specified abnormal findings of blood chemistry: Secondary | ICD-10-CM

## 2022-07-13 DIAGNOSIS — E782 Mixed hyperlipidemia: Secondary | ICD-10-CM | POA: Diagnosis not present

## 2022-07-13 DIAGNOSIS — M25562 Pain in left knee: Secondary | ICD-10-CM

## 2022-07-13 DIAGNOSIS — Z7985 Long-term (current) use of injectable non-insulin antidiabetic drugs: Secondary | ICD-10-CM

## 2022-07-13 LAB — POCT GLYCOSYLATED HEMOGLOBIN (HGB A1C): HbA1c, POC (controlled diabetic range): 8.1 % — AB (ref 0.0–7.0)

## 2022-07-13 MED ORDER — DICLOFENAC SODIUM 1 % EX GEL: 2.0000 g | Freq: Four times a day (QID) | CUTANEOUS | 1 refills | Status: DC

## 2022-07-13 MED ORDER — ACCU-CHEK GUIDE VI STRP: ORAL_STRIP | 1 refills | Status: AC

## 2022-07-13 MED ORDER — ACCU-CHEK SOFTCLIX LANCETS MISC: 2 refills | Status: AC

## 2022-07-13 NOTE — Progress Notes (Signed)
Assessment & Plan:  Christina Fields was seen today for diabetes, ear pain, eye pain and knee pain.  Diagnoses and all orders for this visit:  Type 2 diabetes mellitus with hyperglycemia, without long-term current use of insulin  She has been instructed to increase metformin to 500 mg twice a day, continue [place] and take Ozempic as prescribed.  At this time we will hold off on glimepiride. Continue blood sugar control as discussed in office today, low carbohydrate diet, and regular physical exercise as tolerated, 150 minutes per week (30 min each day, 5 days per week, or 50 min 3 days per week). Keep blood sugar logs with fasting goal of 90-130 mg/dl, post prandial (after you eat) less than 180.  For Hypoglycemia: BS <60 and Hyperglycemia BS >400; contact the clinic ASAP. Annual eye exams and foot exams are recommended.  -     POCT glycosylated hemoglobin (Hb A1C) -     CMP14+EGFR -     Microalbumin / creatinine urine ratio -     Ambulatory referral to Ophthalmology -     glucose blood (ACCU-CHEK GUIDE) test strip; USE TO CHECK BLOOD GLUCOSE BY FINGERSTICK TWICE A DAY -     Accu-Chek Softclix Lancets lancets; Check blood glucose level by fingerstick twice per day. E11.65  Primary hypertension Well-controlled Continue all antihypertensives as prescribed.  Reminded to bring in blood pressure log for follow  up appointment.  RECOMMENDATIONS: DASH/Mediterranean Diets are healthier choices for HTN.    Chronic pain of both knees -     diclofenac Sodium (VOLTAREN) 1 % GEL; Apply 2 g topically 4 (four) times daily. Work on losing weight to help reduce joint pain. May alternate with heat and ice application for pain relief. May also alternate with acetaminophen and Ibuprofen as prescribed pain relief. Other alternatives include massage, acupuncture and water aerobics.    Vitamin D deficiency disease -     VITAMIN D 25 Hydroxy (Vit-D Deficiency, Fractures)  Combined hyperlipidemia associated with type  2 diabetes mellitus (HCC) -     Lipid panel  Abnormal CBC -     CBC with Differential    Patient has been counseled on age-appropriate routine health concerns for screening and prevention. These are reviewed and up-to-date. Referrals have been placed accordingly. Immunizations are up-to-date or declined.    Subjective:   Chief Complaint  Patient presents with   Diabetes   Ear Pain    Right ear Feels a sharp pain    Eye Pain   Knee Pain    B/l   HPI Christina Abdelrahim MO Fields 62 y.o. female presents to office today for follow up to DM 2 She is accompanied by her daughter  She has a past medical history of Diabetes mellitus without complication (HCC), Hyperlipidemia, and Hypertension.   DM 2 She is not checking her blood sugars consistently. Only taking metformin 500 mg once a day instead of BID. Has not been taking glimepiride and only recently started taking ozempic a few days ago.   Lab Results  Component Value Date   HGBA1C 8.1 (A) 07/13/2022    Lab Results  Component Value Date   HGBA1C 8.8 (A) 04/14/2022    Hypertension Blood pressure is 1 controlled with lisinopril 10 mg daily. BP Readings from Last 3 Encounters:  07/13/22 132/81  04/13/22 139/84  01/15/22 (!) 149/86     Notes infrequent sharp stabbing pain in the right ear that only last for few seconds.  States this has  been occurring on and off for the past 6 years.  She denies any decreased hearing   Knee pain Endorses chronic knee pain with onset a few months ago.  States she and her family recently moved into a new home and her room is upstairs.  Feels knee pain has worsened due to going up and down stairs at home.   States both eyes sometimes feel gritty and irritated.  Denies any visual changes.     Review of Systems  Constitutional:  Negative for fever, malaise/fatigue and weight loss.  HENT:  Positive for ear pain and tinnitus. Negative for congestion, ear discharge, hearing loss, nosebleeds,  sinus pain and sore throat.   Eyes:  Positive for pain. Negative for blurred vision, double vision, photophobia, discharge and redness.  Respiratory: Negative.  Negative for cough, shortness of breath and stridor.   Cardiovascular: Negative.  Negative for chest pain, palpitations and leg swelling.  Gastrointestinal: Negative.  Negative for heartburn, nausea and vomiting.  Musculoskeletal:  Positive for joint pain. Negative for myalgias.  Neurological: Negative.  Negative for dizziness, focal weakness, seizures and headaches.  Psychiatric/Behavioral: Negative.  Negative for suicidal ideas.        Past Surgical History:  Procedure Laterality Date   ANKLE SURGERY  2004   CESAREAN SECTION     x2    Family History  Problem Relation Age of Onset   Diabetes Mother     Social History Reviewed with no changes to be made today.   Outpatient Medications Prior to Visit  Medication Sig Dispense Refill   atorvastatin (LIPITOR) 40 MG tablet Take 1 tablet (40 mg total) by mouth daily. 90 tablet 1   cyclobenzaprine (FLEXERIL) 5 MG tablet Take 1 tablet (5 mg total) by mouth at bedtime as needed for muscle spasms. 30 tablet 3   dapagliflozin propanediol (FARXIGA) 10 MG TABS tablet TAKE 1 TABLET BY MOUTH EVERY DAY BEFORE BREAKFAST 270 tablet 1   Lancet Devices (MICROLET NEXT LANCING DEVICE) MISC 1 Device by Does not apply route daily. 1 each 0   lisinopril (ZESTRIL) 10 MG tablet Take 1 tablet (10 mg total) by mouth daily. 90 tablet 1   meloxicam (MOBIC) 7.5 MG tablet Take 1 tablet (7.5 mg total) by mouth daily. Prn pain 30 tablet 3   metFORMIN (GLUCOPHAGE) 500 MG tablet Take 1 tablet (500 mg total) by mouth 2 (two) times daily with a meal. 180 tablet 3   Semaglutide,0.25 or 0.5MG /DOS, (OZEMPIC, 0.25 OR 0.5 MG/DOSE,) 2 MG/3ML SOPN Inject 0.25 mg into the skin once a week. 9 mL 0   Vitamin D, Ergocalciferol, (DRISDOL) 1.25 MG (50000 UNIT) CAPS capsule Take 1 capsule (50,000 Units total) by mouth every  7 (seven) days. Please fill as a 90 day supply 12 capsule 0   Accu-Chek Softclix Lancets lancets Check blood glucose level by fingerstick twice per day. E11.65 100 each 2   diclofenac Sodium (VOLTAREN) 1 % GEL Apply 2 g topically 4 (four) times daily. 100 g 1   glimepiride (AMARYL) 4 MG tablet Take 2 tablets (8 mg total) by mouth daily with breakfast. 180 tablet 1   glucose blood (ACCU-CHEK GUIDE) test strip USE TO CHECK BLOOD GLUCOSE BY FINGERSTICK TWICE A DAY 200 strip 1   No facility-administered medications prior to visit.    No Known Allergies     Objective:    BP 132/81   Pulse (!) 54   Resp 16   Wt 231 lb 9.6 oz (105.1  kg)   LMP  (LMP Unknown)   SpO2 98%   BMI 34.20 kg/m  Wt Readings from Last 3 Encounters:  07/13/22 231 lb 9.6 oz (105.1 kg)  04/13/22 236 lb 3.2 oz (107.1 kg)  01/15/22 239 lb 9.6 oz (108.7 kg)    Physical Exam Vitals and nursing note reviewed.  Constitutional:      Appearance: She is well-developed.  HENT:     Head: Normocephalic and atraumatic.     Right Ear: Hearing, tympanic membrane, ear canal and external ear normal.     Left Ear: Hearing, tympanic membrane, ear canal and external ear normal.  Cardiovascular:     Rate and Rhythm: Normal rate and regular rhythm.     Heart sounds: Normal heart sounds. No murmur heard.    No friction rub. No gallop.  Pulmonary:     Effort: Pulmonary effort is normal. No tachypnea or respiratory distress.     Breath sounds: Normal breath sounds. No decreased breath sounds, wheezing, rhonchi or rales.  Chest:     Chest wall: No tenderness.  Abdominal:     General: Bowel sounds are normal.     Palpations: Abdomen is soft.  Musculoskeletal:        General: Normal range of motion.     Cervical back: Normal range of motion.  Skin:    General: Skin is warm and dry.  Neurological:     Mental Status: She is alert and oriented to person, place, and time.     Coordination: Coordination normal.  Psychiatric:         Behavior: Behavior normal. Behavior is cooperative.        Thought Content: Thought content normal.        Judgment: Judgment normal.          Patient has been counseled extensively about nutrition and exercise as well as the importance of adherence with medications and regular follow-up. The patient was given clear instructions to go to ER or return to medical center if symptoms don't improve, worsen or new problems develop. The patient verbalized understanding.   Follow-up: Return in about 6 weeks (around 08/24/2022) for METER CHECK.   Claiborne Rigg, FNP-BC [facility] [place], [place] [phone]   07/13/2022, 4:26 PM

## 2022-07-14 LAB — CMP14+EGFR
ALT: 12 IU/L (ref 0–32)
AST: 11 IU/L (ref 0–40)
Albumin/Globulin Ratio: 1.2 (ref 1.2–2.2)
Albumin: 3.8 g/dL — ABNORMAL LOW (ref 3.9–4.9)
Alkaline Phosphatase: 82 IU/L (ref 44–121)
BUN/Creatinine Ratio: 25 (ref 12–28)
BUN: 18 mg/dL (ref 8–27)
Bilirubin Total: 0.2 mg/dL (ref 0.0–1.2)
CO2: 23 mmol/L (ref 20–29)
Calcium: 9.4 mg/dL (ref 8.7–10.3)
Chloride: 106 mmol/L (ref 96–106)
Creatinine, Ser: 0.71 mg/dL (ref 0.57–1.00)
Globulin, Total: 3.2 g/dL (ref 1.5–4.5)
Glucose: 167 mg/dL — ABNORMAL HIGH (ref 70–99)
Potassium: 4.5 mmol/L (ref 3.5–5.2)
Sodium: 142 mmol/L (ref 134–144)
Total Protein: 7 g/dL (ref 6.0–8.5)
eGFR: 96 mL/min/{1.73_m2} (ref >59)

## 2022-07-14 LAB — CBC WITH DIFFERENTIAL/PLATELET
Basophils Absolute: 0.1 10*3/uL (ref 0.0–0.2)
Basos: 1 %
EOS (ABSOLUTE): 0.5 10*3/uL — ABNORMAL HIGH (ref 0.0–0.4)
Eos: 8 %
Hematocrit: 42 % (ref 34.0–46.6)
Hemoglobin: 13.8 g/dL (ref 11.1–15.9)
Immature Grans (Abs): 0 10*3/uL (ref 0.0–0.1)
Immature Granulocytes: 0 %
Lymphocytes Absolute: 2 10*3/uL (ref 0.7–3.1)
Lymphs: 32 %
MCH: 28.9 pg (ref 26.6–33.0)
MCHC: 32.9 g/dL (ref 31.5–35.7)
MCV: 88 fL (ref 79–97)
Monocytes Absolute: 0.3 10*3/uL (ref 0.1–0.9)
Monocytes: 5 %
Neutrophils Absolute: 3.4 10*3/uL (ref 1.4–7.0)
Neutrophils: 54 %
Platelets: 228 10*3/uL (ref 150–450)
RBC: 4.77 x10E6/uL (ref 3.77–5.28)
RDW: 13.2 % (ref 11.7–15.4)
WBC: 6.3 10*3/uL (ref 3.4–10.8)

## 2022-07-14 LAB — LIPID PANEL
Chol/HDL Ratio: 4.2 ratio (ref 0.0–4.4)
Cholesterol, Total: 221 mg/dL — ABNORMAL HIGH (ref 100–199)
HDL: 53 mg/dL (ref >39)
LDL Chol Calc (NIH): 139 mg/dL — ABNORMAL HIGH (ref 0–99)
Triglycerides: 163 mg/dL — ABNORMAL HIGH (ref 0–149)
VLDL Cholesterol Cal: 29 mg/dL (ref 5–40)

## 2022-07-14 LAB — MICROALBUMIN / CREATININE URINE RATIO
Creatinine, Urine: 56.9 mg/dL
Microalb/Creat Ratio: 6 mg/g creat (ref 0–29)
Microalbumin, Urine: 3.6 ug/mL

## 2022-07-14 LAB — VITAMIN D 25 HYDROXY (VIT D DEFICIENCY, FRACTURES): Vit D, 25-Hydroxy: 35.1 ng/mL (ref 30.0–100.0)

## 2022-07-21 ENCOUNTER — Other Ambulatory Visit: Payer: Self-pay

## 2022-07-30 ENCOUNTER — Other Ambulatory Visit: Payer: Self-pay | Admitting: Nurse Practitioner

## 2022-07-30 DIAGNOSIS — Z0000 Encounter for general adult medical examination without abnormal findings: Secondary | ICD-10-CM

## 2022-08-19 ENCOUNTER — Other Ambulatory Visit: Payer: Self-pay

## 2022-08-19 ENCOUNTER — Ambulatory Visit: Payer: BC Managed Care – PPO | Attending: Physician Assistant | Admitting: Physician Assistant

## 2022-08-19 ENCOUNTER — Encounter: Payer: Self-pay | Admitting: Physician Assistant

## 2022-08-19 VITALS — BP 137/84 | HR 68 | Wt 229.8 lb

## 2022-08-19 DIAGNOSIS — E1165 Type 2 diabetes mellitus with hyperglycemia: Secondary | ICD-10-CM

## 2022-08-19 DIAGNOSIS — Z7985 Long-term (current) use of injectable non-insulin antidiabetic drugs: Secondary | ICD-10-CM

## 2022-08-19 DIAGNOSIS — Z7984 Long term (current) use of oral hypoglycemic drugs: Secondary | ICD-10-CM

## 2022-08-19 LAB — GLUCOSE, POCT (MANUAL RESULT ENTRY): POC Glucose: 193 mg/dl — AB (ref 70–99)

## 2022-08-19 MED ORDER — OZEMPIC (0.25 OR 0.5 MG/DOSE) 2 MG/3ML ~~LOC~~ SOPN
0.5000 mg | PEN_INJECTOR | SUBCUTANEOUS | 1 refills | Status: DC
  Filled 2022-08-19: qty 3, 28d supply, fill #0

## 2022-08-19 NOTE — Progress Notes (Signed)
Patient ID: Christina Fields, female   DOB: [birth year], 62 y.o.   MRN: [id number]   Christina Fields, is a 62 y.o. female  [id number]  [id number]  DOB - Mar 24, 1960  Chief Complaint  Patient presents with   Diabetes   Medication Refill       Subjective:   Christina Fields is a 62 y.o. female here today for a follow up visit after starting Ozempic 0.25mg  ~5 weeks ago.  Blood sugars running from 180-300 on meter since starting it.  She is due her 4th injection at 0.25 today.  No issues or concerns.  Tolerating with minimal HA and GI symptoms.    No problems updated.  ALLERGIES: No Known Allergies  PAST MEDICAL HISTORY: Past Medical History:  Diagnosis Date   Diabetes mellitus without complication (HCC)    Hyperlipidemia    Hypertension     MEDICATIONS AT HOME: Prior to Admission medications   Medication Sig Start Date End Date Taking? Authorizing Provider  Accu-Chek Softclix Lancets lancets Check blood glucose level by fingerstick twice per day. E11.65 07/13/22  Yes Claiborne Rigg, NP  atorvastatin (LIPITOR) 40 MG tablet Take 1 tablet (40 mg total) by mouth daily. 01/15/22  Yes Marcelina Mclaurin, Marzella Schlein, PA-C  cyclobenzaprine (FLEXERIL) 5 MG tablet Take 1 tablet (5 mg total) by mouth at bedtime as needed for muscle spasms. 04/30/21  Yes Claiborne Rigg, NP  dapagliflozin propanediol (FARXIGA) 10 MG TABS tablet TAKE 1 TABLET BY MOUTH EVERY DAY BEFORE BREAKFAST 01/15/22  Yes Aydien Majette M, PA-C  diclofenac Sodium (VOLTAREN) 1 % GEL Apply 2 g topically 4 (four) times daily. 07/13/22  Yes Claiborne Rigg, NP  glucose blood (ACCU-CHEK GUIDE) test strip USE TO CHECK BLOOD GLUCOSE BY FINGERSTICK TWICE A DAY 07/13/22  Yes Claiborne Rigg, NP  Lancet Devices (MICROLET NEXT LANCING DEVICE) MISC 1 Device by Does not apply route daily. 07/25/19  Yes Claiborne Rigg, NP  lisinopril (ZESTRIL) 10 MG tablet Take 1 tablet (10 mg total) by mouth daily. 04/14/22  Yes Claiborne Rigg, NP   meloxicam (MOBIC) 7.5 MG tablet Take 1 tablet (7.5 mg total) by mouth daily. Prn pain 01/15/22  Yes Anders Simmonds, PA-C  metFORMIN (GLUCOPHAGE) 500 MG tablet Take 1 tablet (500 mg total) by mouth 2 (two) times daily with a meal. 01/15/22  Yes Novalyn Lajara M, PA-C  Vitamin D, Ergocalciferol, (DRISDOL) 1.25 MG (50000 UNIT) CAPS capsule Take 1 capsule (50,000 Units total) by mouth every 7 (seven) days. Please fill as a 90 day supply 07/02/22  Yes Claiborne Rigg, NP  Semaglutide,0.25 or 0.5MG /DOS, (OZEMPIC, 0.25 OR 0.5 MG/DOSE,) 2 MG/3ML SOPN Inject 0.5 mg into the skin once a week. 08/19/22   Kyndall Amero, Marzella Schlein, PA-C    ROS: Neg HEENT Neg resp Neg cardiac Neg GI Neg GU Neg MS Neg psych Neg neuro  Objective:   Vitals:   08/19/22 1426  BP: 137/84  Pulse: 68  SpO2: 99%  Weight: 229 lb 12.8 oz (104.2 kg)   Exam General appearance : Awake, alert, not in any distress. Speech Clear. Not toxic looking HEENT: Atraumatic and Normocephalic Neck: Supple, no JVD. No cervical lymphadenopathy.  Chest: Good air entry bilaterally, CTAB.  No rales/rhonchi/wheezing CVS: S1 S2 regular, no murmurs.  Extremities: B/L Lower Ext shows no edema, both legs are warm to touch Neurology: Awake alert, and oriented X 3, CN II-XII intact, Non focal Skin: No Rash  Data Review Lab Results  Component Value Date   HGBA1C 8.1 (A) 07/13/2022   HGBA1C 8.8 (A) 04/14/2022   HGBA1C 9.2 (A) 01/15/2022    Assessment & Plan   1. Type 2 diabetes mellitus with hyperglycemia, without long-term current use of insulin (HCC) Increase dose to 0.5 starting next week and continue metformin bid and farxiga - Glucose (CBG) - Semaglutide,0.25 or 0.5MG /DOS, (OZEMPIC, 0.25 OR 0.5 MG/DOSE,) 2 MG/3ML SOPN; Inject 0.5 mg into the skin once a week.  Dispense: 9 mL; Refill: 1    Return in about 2 months (around 10/19/2022) for Zelda for chronic conditions and A1C.  The patient was given clear instructions to go to ER or  return to medical center if symptoms don't improve, worsen or new problems develop. The patient verbalized understanding. The patient was told to call to get lab results if they haven't heard anything in the next week.      Georgian Co, PA-C [facility] [place], [place] [phone]   08/19/2022, 2:39 PM

## 2022-08-19 NOTE — Patient Instructions (Addendum)
Start the 0.5mg  ozempic with next weeks dose  Continue to check blood sugars   [facility]   [address]   [place], [place] [address] PH# [phone]  Fax  [phone]

## 2022-08-31 ENCOUNTER — Ambulatory Visit
Admission: RE | Admit: 2022-08-31 | Discharge: 2022-08-31 | Disposition: A | Discharge status: Home / Self Care | Payer: BC Managed Care – PPO | Source: Ambulatory Visit | Attending: Nurse Practitioner | Admitting: Nurse Practitioner

## 2022-08-31 DIAGNOSIS — Z0000 Encounter for general adult medical examination without abnormal findings: Secondary | ICD-10-CM

## 2022-09-02 ENCOUNTER — Other Ambulatory Visit: Payer: Self-pay | Admitting: Physician Assistant

## 2022-09-02 DIAGNOSIS — E1165 Type 2 diabetes mellitus with hyperglycemia: Secondary | ICD-10-CM

## 2022-09-02 NOTE — Telephone Encounter (Signed)
Dc'd 07/13/22 D/C' d by provider by Z. Meredeth Ide NP  Requested Prescriptions  Refused Prescriptions Disp Refills   glimepiride (AMARYL) 4 MG tablet [Pharmacy Med Name: GLIMEPIRIDE 4 MG TABLET] 180 tablet 1    Sig: TAKE 2 TABLETS BY MOUTH DAILY WITH BREAKFAST.     Endocrinology:  Diabetes - Sulfonylureas Failed - 09/02/2022  2:02 AM      Failed - HBA1C is between 0 and 7.9 and within 180 days    HbA1c, POC (controlled diabetic range)  Date Value Ref Range Status  07/13/2022 8.1 (A) 0.0 - 7.0 % Final         Passed - Cr in normal range and within 360 days    Creatinine, Ser  Date Value Ref Range Status  07/13/2022 0.71 0.57 - 1.00 mg/dL Final         Passed - Valid encounter within last 6 months    Recent Outpatient Visits           2 weeks ago Type 2 diabetes mellitus with hyperglycemia, without long-term current use of insulin [facility])   [place] [facility] [place], [place], [place]   1 month ago Type 2 diabetes mellitus with hyperglycemia, without long-term current use of insulin [facility])   [place] [place], Shea Stakes, NP   4 months ago Primary hypertension   [place] [place], [place] W, NP   7 months ago Type 2 diabetes mellitus with hyperglycemia, without long-term current use of insulin [facility])   [place] [facility] [place], [place], [place]   1 year ago Type 2 diabetes mellitus with hyperglycemia, without long-term current use of insulin [facility])   [place] [facility] [place], Shea Stakes, NP       Future Appointments             In 1 month Claiborne Rigg, NP [facility]

## 2022-09-19 ENCOUNTER — Other Ambulatory Visit: Payer: Self-pay | Admitting: Nurse Practitioner

## 2022-09-19 DIAGNOSIS — E559 Vitamin D deficiency, unspecified: Secondary | ICD-10-CM

## 2022-09-21 NOTE — Telephone Encounter (Signed)
Requested medications are due for refill today.  yes  Requested medications are on the active medications list.  yes  Last refill. 07/02/2022 #12 0 rf  Future visit scheduled.   yes  Notes to clinic.  Refill not delegated.    Requested Prescriptions  Pending Prescriptions Disp Refills   Vitamin D, Ergocalciferol, (DRISDOL) 1.25 MG (50000 UNIT) CAPS capsule [Pharmacy Med Name: VITAMIN D2 1.25MG (50,000 UNIT)] 12 capsule 0    Sig: Take 1 capsule (50,000 Units total) by mouth every 7 (seven) days. Please fill as a 90 day supply     Endocrinology:  Vitamins - Vitamin D Supplementation 2 Failed - 09/19/2022  4:09 PM      Failed - Manual Review: Route requests for 50,000 IU strength to the provider      Passed - Ca in normal range and within 360 days    Calcium  Date Value Ref Range Status  07/13/2022 9.4 8.7 - 10.3 mg/dL Final         Passed - Vitamin D in normal range and within 360 days    Vit D, 25-Hydroxy  Date Value Ref Range Status  07/13/2022 35.1 30.0 - 100.0 ng/mL Final    Comment:    Vitamin D deficiency has been defined by the Institute of Medicine and an Endocrine Society practice guideline as a level of serum 25-OH vitamin D less than 20 ng/mL (1,2). The Endocrine Society went on to further define vitamin D insufficiency as a level between 21 and 29 ng/mL (2). 1. IOM (Institute of Medicine). 2010. Dietary reference    intakes for calcium and D. [place]: The    [employer]. 2. Holick MF, Binkley Goulding, Bischoff-Ferrari HA, et al.    Evaluation, treatment, and prevention of vitamin D    deficiency: an Endocrine Society clinical practice    guideline. JCEM. 2011 Jul; 96(7):1911-30.          Passed - Valid encounter within last 12 months    Recent Outpatient Visits           1 month ago Type 2 diabetes mellitus with hyperglycemia, without long-term current use of insulin [facility])   [place] [facility] [place], [place], [place]    2 months ago Type 2 diabetes mellitus with hyperglycemia, without long-term current use of insulin [facility])   [place] [facility] [place], Shea Stakes, NP   5 months ago Primary hypertension   [place] [facility] [place], [place] W, NP   8 months ago Type 2 diabetes mellitus with hyperglycemia, without long-term current use of insulin [facility])    [facility] [place], [place], [place]   1 year ago Type 2 diabetes mellitus with hyperglycemia, without long-term current use of insulin [facility])    [facility] [place], Shea Stakes, NP       Future Appointments             In 4 weeks Claiborne Rigg, NP [facility]

## 2022-09-25 ENCOUNTER — Other Ambulatory Visit: Payer: Self-pay | Admitting: Nurse Practitioner

## 2022-09-25 DIAGNOSIS — E1165 Type 2 diabetes mellitus with hyperglycemia: Secondary | ICD-10-CM

## 2022-09-30 ENCOUNTER — Other Ambulatory Visit: Payer: Self-pay | Admitting: Nurse Practitioner

## 2022-09-30 DIAGNOSIS — I10 Essential (primary) hypertension: Secondary | ICD-10-CM

## 2022-10-19 ENCOUNTER — Ambulatory Visit: Payer: BC Managed Care – PPO | Attending: Nurse Practitioner | Admitting: Nurse Practitioner

## 2022-10-19 ENCOUNTER — Encounter: Payer: Self-pay | Admitting: Nurse Practitioner

## 2022-10-19 VITALS — BP 137/82 | HR 82 | Ht 69.0 in | Wt 224.4 lb

## 2022-10-19 DIAGNOSIS — E1165 Type 2 diabetes mellitus with hyperglycemia: Secondary | ICD-10-CM | POA: Diagnosis not present

## 2022-10-19 DIAGNOSIS — Z7985 Long-term (current) use of injectable non-insulin antidiabetic drugs: Secondary | ICD-10-CM

## 2022-10-19 DIAGNOSIS — I10 Essential (primary) hypertension: Secondary | ICD-10-CM

## 2022-10-19 DIAGNOSIS — Z7984 Long term (current) use of oral hypoglycemic drugs: Secondary | ICD-10-CM

## 2022-10-19 DIAGNOSIS — K219 Gastro-esophageal reflux disease without esophagitis: Secondary | ICD-10-CM

## 2022-10-19 DIAGNOSIS — Z23 Encounter for immunization: Secondary | ICD-10-CM

## 2022-10-19 LAB — POCT GLYCOSYLATED HEMOGLOBIN (HGB A1C): Hemoglobin A1C: 8 % — AB (ref 4.0–5.6)

## 2022-10-19 MED ORDER — SEMAGLUTIDE (1 MG/DOSE) 4 MG/3ML ~~LOC~~ SOPN
1.0000 mg | PEN_INJECTOR | SUBCUTANEOUS | 1 refills | Status: DC
Start: 2022-10-19 — End: 2023-05-07

## 2022-10-19 MED ORDER — OMEPRAZOLE 20 MG PO CPDR
20.0000 mg | DELAYED_RELEASE_CAPSULE | Freq: Every day | ORAL | 3 refills | Status: DC
Start: 2022-10-19 — End: 2023-12-03

## 2022-10-19 NOTE — Progress Notes (Signed)
Assessment & Plan:  Christina Fields was seen today for medical management of chronic issues.  Diagnoses and all orders for this visit:  Type 2 diabetes mellitus with hyperglycemia, without long-term current use of insulin (HCC) -     POCT glycosylated hemoglobin (Hb A1C) -     Semaglutide, 1 MG/DOSE, 4 MG/3ML SOPN; Inject 1 mg as directed once a week. -     CMP14+EGFR Continue blood sugar control as discussed in office today, low carbohydrate diet, and regular physical exercise as tolerated, 150 minutes per week (30 min each day, 5 days per week, or 50 min 3 days per week). Keep blood sugar logs with fasting goal of 90-130 mg/dl, post prandial (after you eat) less than 180.  For Hypoglycemia: BS <60 and Hyperglycemia BS >400; contact the clinic ASAP. Annual eye exams and foot exams are recommended.   Primary hypertension Continue all antihypertensives as prescribed.  Reminded to bring in blood pressure log for follow  up appointment.  RECOMMENDATIONS: DASH/Mediterranean Diets are healthier choices for HTN.    Need for shingles vaccine -     Cancel: Varicella-zoster vaccine IM  GERD without esophagitis -     omeprazole (PRILOSEC) 20 MG capsule; Take 1 capsule (20 mg total) by mouth daily. INSTRUCTIONS: Avoid GERD Triggers: acidic, spicy or fried foods, caffeine, coffee, sodas,  alcohol and chocolate.      Patient has been counseled on age-appropriate routine health concerns for screening and prevention. These are reviewed and up-to-date. Referrals have been placed accordingly. Immunizations are up-to-date or declined.    Subjective:   Chief Complaint  Patient presents with   Medical Management of Chronic Issues   HPI Christina Fields MO Christina Fields 62 y.o. female presents to office today for follow up to DM.  She is accompanied by her daughter as well.  She has a past medical history of DM 2, Hyperlipidemia, and Hypertension.    DM 2 She is currently prescribed Ozempic 0.5 mg weekly,  metformin 500 mg twice daily and Farxiga 10 mg daily.  She has her blood glucose meter with her today however reports she is only checking her blood sugars prior to dinner with no postprandial or fasting readings being performed.   Meter averages are as follows:  7 day average 171 14 day average 157 30 day average 148 90 day average 129 Lab Results  Component Value Date   HGBA1C 8.0 (A) 10/19/2022     Lab Results  Component Value Date   HGBA1C 8.1 (A) 07/13/2022    HTN Blood pressure is near goal and she is taking lisinopril 10 mg daily as prescribed. BP Readings from Last 3 Encounters:  10/19/22 137/82  08/19/22 137/84  07/13/22 132/81    GERD: Paitent complains of heartburn. This has been associated with deep pressure at base of neck, heartburn, midespigastric pain, and upper abdominal discomfort.  She denies hematemesis, melena, nausea, shortness of breath, and unexpected weight loss. Symptoms have been present for several weeks. She denies dysphagia.  She has not lost weight. She denies melena, hematochezia, hematemesis, and coffee ground emesis. Medical therapy in the past has included none.  l   Review of Systems  Constitutional:  Negative for fever, malaise/fatigue and weight loss.  HENT: Negative.  Negative for nosebleeds.   Eyes: Negative.  Negative for blurred vision, double vision and photophobia.  Respiratory: Negative.  Negative for cough and shortness of breath.   Cardiovascular: Negative.  Negative for chest pain, palpitations and leg swelling.  Gastrointestinal:  Positive for heartburn. Negative for abdominal pain, blood in stool, constipation, diarrhea, melena, nausea and vomiting.  Musculoskeletal: Negative.  Negative for myalgias.  Neurological: Negative.  Negative for dizziness, focal weakness, seizures and headaches.  Psychiatric/Behavioral: Negative.  Negative for suicidal ideas.     Past Medical History:  Diagnosis Date   Diabetes mellitus without  complication (HCC)    Hyperlipidemia    Hypertension     Past Surgical History:  Procedure Laterality Date   ANKLE SURGERY  2004   CESAREAN SECTION     x2    Family History  Problem Relation Age of Onset   Diabetes Mother    Breast cancer Neg Hx     Social History Reviewed with no changes to be made today.   Outpatient Medications Prior to Visit  Medication Sig Dispense Refill   Accu-Chek Softclix Lancets lancets Check blood glucose level by fingerstick twice per day. E11.65 100 each 2   atorvastatin (LIPITOR) 40 MG tablet Take 1 tablet (40 mg total) by mouth daily. 90 tablet 1   dapagliflozin propanediol (FARXIGA) 10 MG TABS tablet TAKE 1 TABLET BY MOUTH EVERY DAY BEFORE BREAKFAST 270 tablet 1   glucose blood (ACCU-CHEK GUIDE) test strip USE TO CHECK BLOOD GLUCOSE BY FINGERSTICK TWICE A DAY 200 strip 1   Lancet Devices (MICROLET NEXT LANCING DEVICE) MISC 1 Device by Does not apply route daily. 1 each 0   lisinopril (ZESTRIL) 10 MG tablet TAKE 1 TABLET BY MOUTH EVERY DAY 90 tablet 0   meloxicam (MOBIC) 7.5 MG tablet Take 1 tablet (7.5 mg total) by mouth daily. Prn pain 30 tablet 3   metFORMIN (GLUCOPHAGE) 500 MG tablet Take 1 tablet (500 mg total) by mouth 2 (two) times daily with a meal. 180 tablet 3   Semaglutide,0.25 or 0.5MG /DOS, (OZEMPIC, 0.25 OR 0.5 MG/DOSE,) 2 MG/3ML SOPN Inject 0.5 mg into the skin once a week. 3 mL 1   cyclobenzaprine (FLEXERIL) 5 MG tablet Take 1 tablet (5 mg total) by mouth at bedtime as needed for muscle spasms. (Patient not taking: Reported on 10/19/2022) 30 tablet 3   diclofenac Sodium (VOLTAREN) 1 % GEL Apply 2 g topically 4 (four) times daily. (Patient not taking: Reported on 10/19/2022) 100 g 1   Vitamin D, Ergocalciferol, (DRISDOL) 1.25 MG (50000 UNIT) CAPS capsule Take 1 capsule (50,000 Units total) by mouth every 7 (seven) days. Please fill as a 90 day supply (Patient not taking: Reported on 10/19/2022) 12 capsule 0   No facility-administered  medications prior to visit.    No Known Allergies     Objective:    BP 137/82 (BP Location: Left Arm, Patient Position: Sitting, Cuff Size: Normal)   Pulse 82   Ht 5\' 9"  (1.753 m)   Wt 224 lb 6.4 oz (101.8 kg)   LMP  (LMP Unknown)   SpO2 100%   BMI 33.14 kg/m  Wt Readings from Last 3 Encounters:  10/19/22 224 lb 6.4 oz (101.8 kg)  08/19/22 229 lb 12.8 oz (104.2 kg)  07/13/22 231 lb 9.6 oz (105.1 kg)    Physical Exam Vitals and nursing note reviewed.  Constitutional:      Appearance: She is well-developed.  HENT:     Head: Normocephalic and atraumatic.  Cardiovascular:     Rate and Rhythm: Normal rate and regular rhythm.     Heart sounds: Normal heart sounds. No murmur heard.    No friction rub. No gallop.  Pulmonary:  Effort: Pulmonary effort is normal. No tachypnea or respiratory distress.     Breath sounds: Normal breath sounds. No decreased breath sounds, wheezing, rhonchi or rales.  Chest:     Chest wall: No tenderness.  Abdominal:     General: Bowel sounds are normal.     Palpations: Abdomen is soft.  Musculoskeletal:        General: Normal range of motion.     Cervical back: Normal range of motion.  Skin:    General: Skin is warm and dry.  Neurological:     Mental Status: She is alert and oriented to person, place, and time.     Coordination: Coordination normal.  Psychiatric:        Behavior: Behavior normal. Behavior is cooperative.        Thought Content: Thought content normal.        Judgment: Judgment normal.          Patient has been counseled extensively about nutrition and exercise as well as the importance of adherence with medications and regular follow-up. The patient was given clear instructions to go to ER or return to medical center if symptoms don't improve, worsen or new problems develop. The patient verbalized understanding.   Follow-up: Return in about 3 months (around 01/19/2023).   Claiborne Rigg, FNP-BC [facility] [place], [place] [phone]   10/19/2022, 4:09 PM

## 2022-10-19 NOTE — Patient Instructions (Signed)
[facility]   [address] [place], [place] [address] PH# [phone]  Fax  [phone]

## 2022-10-21 ENCOUNTER — Other Ambulatory Visit: Payer: Self-pay

## 2022-10-22 ENCOUNTER — Encounter: Payer: Self-pay | Admitting: Nurse Practitioner

## 2022-10-29 ENCOUNTER — Telehealth (INDEPENDENT_AMBULATORY_CARE_PROVIDER_SITE_OTHER): Payer: Self-pay | Admitting: Nurse Practitioner

## 2022-10-29 NOTE — Telephone Encounter (Signed)
Copied from CRM [id number]. Topic: Appointment Scheduling - Scheduling Inquiry for Clinic >> Oct 26, 2022  1:50 PM Phill Myron wrote: Daughter called and said pt Torelli was instructed by NP Meredeth Ide to return in Two weeks.  ([facility] is out to October for appts)

## 2022-10-29 NOTE — Telephone Encounter (Signed)
Will confirm when provider is  back in office.

## 2022-11-02 ENCOUNTER — Other Ambulatory Visit: Payer: Self-pay | Admitting: Nurse Practitioner

## 2022-11-02 DIAGNOSIS — R7989 Other specified abnormal findings of blood chemistry: Secondary | ICD-10-CM

## 2022-11-02 NOTE — Telephone Encounter (Signed)
She was instructed to stop by the lab 2 weeks lab. Message in [place]. CMP

## 2022-11-13 ENCOUNTER — Ambulatory Visit: Payer: BC Managed Care – PPO | Attending: Nurse Practitioner

## 2022-11-13 DIAGNOSIS — R7989 Other specified abnormal findings of blood chemistry: Secondary | ICD-10-CM

## 2022-11-14 LAB — CMP14+EGFR
ALT: 141 IU/L — ABNORMAL HIGH (ref 0–32)
AST: 102 IU/L — ABNORMAL HIGH (ref 0–40)
Albumin: 4 g/dL (ref 3.9–4.9)
Alkaline Phosphatase: 217 IU/L — ABNORMAL HIGH (ref 44–121)
BUN/Creatinine Ratio: 19 (ref 12–28)
BUN: 14 mg/dL (ref 8–27)
Bilirubin Total: 0.3 mg/dL (ref 0.0–1.2)
CO2: 22 mmol/L (ref 20–29)
Calcium: 9.4 mg/dL (ref 8.7–10.3)
Chloride: 104 mmol/L (ref 96–106)
Creatinine, Ser: 0.74 mg/dL (ref 0.57–1.00)
Globulin, Total: 2.9 g/dL (ref 1.5–4.5)
Glucose: 141 mg/dL — ABNORMAL HIGH (ref 70–99)
Potassium: 4.8 mmol/L (ref 3.5–5.2)
Sodium: 140 mmol/L (ref 134–144)
Total Protein: 6.9 g/dL (ref 6.0–8.5)
eGFR: 91 mL/min/{1.73_m2} (ref >59)

## 2022-11-15 ENCOUNTER — Other Ambulatory Visit: Payer: Self-pay | Admitting: Nurse Practitioner

## 2022-11-15 DIAGNOSIS — R748 Abnormal levels of other serum enzymes: Secondary | ICD-10-CM

## 2022-11-15 NOTE — Progress Notes (Signed)
lft

## 2022-11-24 ENCOUNTER — Other Ambulatory Visit: Payer: BC Managed Care – PPO

## 2022-11-30 ENCOUNTER — Ambulatory Visit: Payer: BC Managed Care – PPO | Attending: Nurse Practitioner

## 2022-11-30 DIAGNOSIS — R748 Abnormal levels of other serum enzymes: Secondary | ICD-10-CM

## 2022-12-01 LAB — LIPASE: Lipase: 36 U/L (ref 14–72)

## 2022-12-01 LAB — HEPATIC FUNCTION PANEL
ALT: 150 IU/L — ABNORMAL HIGH (ref 0–32)
AST: 79 IU/L — ABNORMAL HIGH (ref 0–40)
Albumin: 3.6 g/dL — ABNORMAL LOW (ref 3.9–4.9)
Alkaline Phosphatase: 201 IU/L — ABNORMAL HIGH (ref 44–121)
Bilirubin Total: <0.2 mg/dL (ref 0.0–1.2)
Bilirubin, Direct: <0.1 mg/dL (ref 0.00–0.40)
Total Protein: 6.5 g/dL (ref 6.0–8.5)

## 2022-12-01 LAB — AMYLASE: Amylase: 59 U/L (ref 31–110)

## 2022-12-04 ENCOUNTER — Other Ambulatory Visit: Payer: Self-pay | Admitting: Nurse Practitioner

## 2022-12-04 DIAGNOSIS — R7989 Other specified abnormal findings of blood chemistry: Secondary | ICD-10-CM

## 2022-12-08 ENCOUNTER — Other Ambulatory Visit: Payer: BC Managed Care – PPO

## 2022-12-18 ENCOUNTER — Ambulatory Visit
Admission: RE | Admit: 2022-12-18 | Discharge: 2022-12-18 | Disposition: A | Discharge status: Home / Self Care | Payer: BC Managed Care – PPO | Source: Ambulatory Visit | Attending: Nurse Practitioner | Admitting: Nurse Practitioner

## 2022-12-18 ENCOUNTER — Other Ambulatory Visit: Payer: BC Managed Care – PPO

## 2022-12-18 DIAGNOSIS — R7989 Other specified abnormal findings of blood chemistry: Secondary | ICD-10-CM

## 2022-12-26 ENCOUNTER — Other Ambulatory Visit: Payer: Self-pay | Admitting: Family Medicine

## 2022-12-26 ENCOUNTER — Other Ambulatory Visit: Payer: Self-pay | Admitting: Physician Assistant

## 2022-12-26 DIAGNOSIS — E1169 Type 2 diabetes mellitus with other specified complication: Secondary | ICD-10-CM

## 2022-12-26 DIAGNOSIS — I10 Essential (primary) hypertension: Secondary | ICD-10-CM

## 2022-12-28 ENCOUNTER — Telehealth: Payer: Self-pay | Admitting: Nurse Practitioner

## 2022-12-28 NOTE — Telephone Encounter (Signed)
Pt is calling in because she was told she needed to stop taking Metformin. Pt wants to know what she is supposed to take. Please follow up with pt.

## 2022-12-29 NOTE — Telephone Encounter (Addendum)
Call placed to patient unable to reach message left on VM.   Call to alternate number someone answered x2 then disconnected the call.   Interpreter # [phone]   Call [facility] patient has appointment with GI in January

## 2023-01-19 ENCOUNTER — Encounter: Payer: Self-pay | Admitting: Nurse Practitioner

## 2023-01-19 ENCOUNTER — Ambulatory Visit: Payer: BC Managed Care – PPO | Attending: Nurse Practitioner | Admitting: Nurse Practitioner

## 2023-01-19 VITALS — BP 123/76 | HR 66 | Ht 69.0 in | Wt 218.4 lb

## 2023-01-19 DIAGNOSIS — E1165 Type 2 diabetes mellitus with hyperglycemia: Secondary | ICD-10-CM | POA: Diagnosis not present

## 2023-01-19 DIAGNOSIS — Z23 Encounter for immunization: Secondary | ICD-10-CM

## 2023-01-19 DIAGNOSIS — R748 Abnormal levels of other serum enzymes: Secondary | ICD-10-CM | POA: Diagnosis not present

## 2023-01-19 DIAGNOSIS — Z7984 Long term (current) use of oral hypoglycemic drugs: Secondary | ICD-10-CM

## 2023-01-19 LAB — POCT GLYCOSYLATED HEMOGLOBIN (HGB A1C): Hemoglobin A1C: 8.5 % — AB (ref 4.0–5.6)

## 2023-01-19 NOTE — Progress Notes (Signed)
Assessment & Plan:  Mystery was seen today for medical management of chronic issues.  Diagnoses and all orders for this visit:  Type 2 diabetes mellitus with hyperglycemia, without long-term current use of insulin  Follow up 2 weeks for meter check. If blood glucose levels continue elevated she agrees to start insulin Continue farxiga for now -     POCT glycosylated hemoglobin (Hb A1C)  Elevated liver enzymes -     Hepatitis panel, acute -     Hepatic Function Panel  Encounter for immunization -     Varicella-zoster vaccine IM    Patient has been counseled on age-appropriate routine health concerns for screening and prevention. These are reviewed and up-to-date. Referrals have been placed accordingly. Immunizations are up-to-date or declined.    Subjective:   Chief Complaint  Patient presents with   Medical Management of Chronic Issues    Christina Fields 62 y.o. female presents to office today for follow up to DM. She is accompanied by her daughter today.  I stopped all of her DM meds except for farxiga (metformin, ozempic, glimepiride) due to elevated liver enzymes. Since stopping them her enzymes have decreased but still significantly elevated. Amylase and lipase normal. She denies any abd pain and [place] was normal. She is muslim and does not consume alcohol.   DM 2 A1c increased. We discussed insulin. She is declining this today and states she will make the dietary changes  needed to help lower her A1c.  Lab Results  Component Value Date   HGBA1C 8.5 (A) 01/19/2023       Review of Systems  Constitutional:  Negative for fever, malaise/fatigue and weight loss.  HENT: Negative.  Negative for nosebleeds.   Eyes: Negative.  Negative for blurred vision, double vision and photophobia.  Respiratory: Negative.  Negative for cough and shortness of breath.   Cardiovascular: Negative.  Negative for chest pain, palpitations and leg swelling.  Gastrointestinal:  Negative.  Negative for heartburn, nausea and vomiting.  Musculoskeletal: Negative.  Negative for myalgias.  Neurological: Negative.  Negative for dizziness, focal weakness, seizures and headaches.  Psychiatric/Behavioral: Negative.  Negative for suicidal ideas.     Past Medical History:  Diagnosis Date   Diabetes mellitus without complication (HCC)    Hyperlipidemia    Hypertension     Past Surgical History:  Procedure Laterality Date   ANKLE SURGERY  2004   CESAREAN SECTION     x2    Family History  Problem Relation Age of Onset   Diabetes Mother    Breast cancer Neg Hx     Social History Reviewed with no changes to be made today.   Outpatient Medications Prior to Visit  Medication Sig Dispense Refill   Accu-Chek Softclix Lancets lancets Check blood glucose level by fingerstick twice per day. E11.65 100 each 2   atorvastatin (LIPITOR) 40 MG tablet TAKE 1 TABLET BY MOUTH EVERY DAY 90 tablet 1   cyclobenzaprine (FLEXERIL) 5 MG tablet Take 1 tablet (5 mg total) by mouth at bedtime as needed for muscle spasms. 30 tablet 3   dapagliflozin propanediol (FARXIGA) 10 MG TABS tablet TAKE 1 TABLET BY MOUTH EVERY DAY BEFORE BREAKFAST 270 tablet 1   glucose blood (ACCU-CHEK GUIDE) test strip USE TO CHECK BLOOD GLUCOSE BY FINGERSTICK TWICE A DAY 200 strip 1   Lancet Devices (MICROLET NEXT LANCING DEVICE) MISC 1 Device by Does not apply route daily. 1 each 0   lisinopril (ZESTRIL) 10 MG tablet  TAKE 1 TABLET BY MOUTH EVERY DAY 90 tablet 0   meloxicam (MOBIC) 7.5 MG tablet Take 1 tablet (7.5 mg total) by mouth daily. Prn pain 30 tablet 3   omeprazole (PRILOSEC) 20 MG capsule Take 1 capsule (20 mg total) by mouth daily. 90 capsule 3   diclofenac Sodium (VOLTAREN) 1 % GEL Apply 2 g topically 4 (four) times daily. (Patient not taking: Reported on 10/19/2022) 100 g 1   metFORMIN (GLUCOPHAGE) 500 MG tablet Take 1 tablet (500 mg total) by mouth 2 (two) times daily with a meal. (Patient not taking:  Reported on 01/19/2023) 180 tablet 3   Semaglutide, 1 MG/DOSE, 4 MG/3ML SOPN Inject 1 mg as directed once a week. (Patient not taking: Reported on 01/19/2023) 3 mL 1   Vitamin D, Ergocalciferol, (DRISDOL) 1.25 MG (50000 UNIT) CAPS capsule Take 1 capsule (50,000 Units total) by mouth every 7 (seven) days. Please fill as a 90 day supply (Patient not taking: Reported on 10/19/2022) 12 capsule 0   No facility-administered medications prior to visit.    No Known Allergies     Objective:    BP 123/76 (BP Location: Left Arm, Patient Position: Sitting, Cuff Size: Normal)   Pulse 66   Ht 5\' 9"  (1.753 m)   Wt 218 lb 6.4 oz (99.1 kg)   LMP  (LMP Unknown)   SpO2 100%   BMI 32.25 kg/m  Wt Readings from Last 3 Encounters:  01/19/23 218 lb 6.4 oz (99.1 kg)  10/19/22 224 lb 6.4 oz (101.8 kg)  08/19/22 229 lb 12.8 oz (104.2 kg)    Physical Exam Vitals and nursing note reviewed.  Constitutional:      Appearance: She is well-developed.  HENT:     Head: Normocephalic and atraumatic.  Cardiovascular:     Rate and Rhythm: Normal rate and regular rhythm.     Heart sounds: Normal heart sounds. No murmur heard.    No friction rub. No gallop.  Pulmonary:     Effort: Pulmonary effort is normal. No tachypnea or respiratory distress.     Breath sounds: Normal breath sounds. No decreased breath sounds, wheezing, rhonchi or rales.  Chest:     Chest wall: No tenderness.  Abdominal:     General: Bowel sounds are normal.     Palpations: Abdomen is soft.  Musculoskeletal:        General: Normal range of motion.     Cervical back: Normal range of motion.  Skin:    General: Skin is warm and dry.  Neurological:     Mental Status: She is alert and oriented to person, place, and time.     Coordination: Coordination normal.  Psychiatric:        Behavior: Behavior normal. Behavior is cooperative.        Thought Content: Thought content normal.        Judgment: Judgment normal.          Patient has  been counseled extensively about nutrition and exercise as well as the importance of adherence with medications and regular follow-up. The patient was given clear instructions to go to ER or return to medical center if symptoms don't improve, worsen or new problems develop. The patient verbalized understanding.   Follow-up: Return in about 15 days (around 02/03/2023) for 02-03-2023 DB 410 METER CHECK.   Claiborne Rigg, FNP-BC [facility] and [facility] [place], [place] [phone]   01/19/2023, 8:33 PM

## 2023-01-20 LAB — HEPATIC FUNCTION PANEL
ALT: 26 [IU]/L (ref 0–32)
AST: 19 [IU]/L (ref 0–40)
Albumin: 3.9 g/dL (ref 3.9–4.9)
Alkaline Phosphatase: 94 [IU]/L (ref 44–121)
Bilirubin Total: <0.2 mg/dL (ref 0.0–1.2)
Bilirubin, Direct: 0.09 mg/dL (ref 0.00–0.40)
Total Protein: 7.1 g/dL (ref 6.0–8.5)

## 2023-02-03 ENCOUNTER — Ambulatory Visit: Payer: BC Managed Care – PPO | Attending: Nurse Practitioner | Admitting: Nurse Practitioner

## 2023-02-03 ENCOUNTER — Encounter: Payer: Self-pay | Admitting: Nurse Practitioner

## 2023-02-03 VITALS — BP 104/68 | HR 71 | Temp 97.3°F | Resp 16 | Wt 217.0 lb

## 2023-02-03 DIAGNOSIS — E1165 Type 2 diabetes mellitus with hyperglycemia: Secondary | ICD-10-CM

## 2023-02-03 DIAGNOSIS — Z7985 Long-term (current) use of injectable non-insulin antidiabetic drugs: Secondary | ICD-10-CM | POA: Diagnosis not present

## 2023-02-03 DIAGNOSIS — Z7984 Long term (current) use of oral hypoglycemic drugs: Secondary | ICD-10-CM

## 2023-02-03 NOTE — Progress Notes (Unsigned)
Assessment & Plan:  There are no diagnoses linked to this encounter.  Patient has been counseled on age-appropriate routine health concerns for screening and prevention. These are reviewed and up-to-date. Referrals have been placed accordingly. Immunizations are up-to-date or declined.    Subjective:   Chief Complaint  Patient presents with  . Diabetes    Christina Fields 62 y.o. female presents to office today    [phone]  ROS  Past Medical History:  Diagnosis Date  . Diabetes mellitus without complication (HCC)   . Hyperlipidemia   . Hypertension     Past Surgical History:  Procedure Laterality Date  . ANKLE SURGERY  2004  . CESAREAN SECTION     x2    Family History  Problem Relation Age of Onset  . Diabetes Mother   . Breast cancer Neg Hx     Social History Reviewed with no changes to be made today.   Outpatient Medications Prior to Visit  Medication Sig Dispense Refill  . Accu-Chek Softclix Lancets lancets Check blood glucose level by fingerstick twice per day. E11.65 100 each 2  . atorvastatin (LIPITOR) 40 MG tablet TAKE 1 TABLET BY MOUTH EVERY DAY 90 tablet 1  . dapagliflozin propanediol (FARXIGA) 10 MG TABS tablet TAKE 1 TABLET BY MOUTH EVERY DAY BEFORE BREAKFAST 270 tablet 1  . glucose blood (ACCU-CHEK GUIDE) test strip USE TO CHECK BLOOD GLUCOSE BY FINGERSTICK TWICE A DAY 200 strip 1  . Lancet Devices (MICROLET NEXT LANCING DEVICE) MISC 1 Device by Does not apply route daily. 1 each 0  . lisinopril (ZESTRIL) 10 MG tablet TAKE 1 TABLET BY MOUTH EVERY DAY 90 tablet 0  . meloxicam (MOBIC) 7.5 MG tablet Take 1 tablet (7.5 mg total) by mouth daily. Prn pain 30 tablet 3  . Vitamin D, Ergocalciferol, (DRISDOL) 1.25 MG (50000 UNIT) CAPS capsule Take 1 capsule (50,000 Units total) by mouth every 7 (seven) days. Please fill as a 90 day supply 12 capsule 0  . diclofenac Sodium (VOLTAREN) 1 % GEL Apply 2 g topically 4  (four) times daily. 100 g 1  . cyclobenzaprine (FLEXERIL) 5 MG tablet Take 1 tablet (5 mg total) by mouth at bedtime as needed for muscle spasms. (Patient not taking: Reported on 02/03/2023) 30 tablet 3  . metFORMIN (GLUCOPHAGE) 500 MG tablet Take 1 tablet (500 mg total) by mouth 2 (two) times daily with a meal. (Patient not taking: Reported on 02/03/2023) 180 tablet 3  . omeprazole (PRILOSEC) 20 MG capsule Take 1 capsule (20 mg total) by mouth daily. (Patient not taking: Reported on 02/03/2023) 90 capsule 3  . Semaglutide, 1 MG/DOSE, 4 MG/3ML SOPN Inject 1 mg as directed once a week. (Patient not taking: Reported on 02/03/2023) 3 mL 1   No facility-administered medications prior to visit.    No Known Allergies     Objective:    BP 104/68 (BP Location: Right Arm, Patient Position: Sitting, Cuff Size: Large)   Pulse 71   Temp (!) 97.3 F (36.3 C) (Oral)   Resp 16   Wt 217 lb (98.4 kg)   LMP  (LMP Unknown)   SpO2 100%   BMI 32.05 kg/m  Wt Readings from Last 3 Encounters:  02/03/23 217 lb (98.4 kg)  01/19/23 218 lb 6.4 oz (99.1 kg)  10/19/22 224 lb 6.4 oz (101.8 kg)    Physical Exam       Patient has been counseled  extensively about nutrition and exercise as well as the importance of adherence with medications and regular follow-up. The patient was given clear instructions to go to ER or return to medical center if symptoms don't improve, worsen or new problems develop. The patient verbalized understanding.   Follow-up: No follow-ups on file.   Claiborne Rigg, FNP-BC [facility] and Wellness [place], [place] [phone]   02/03/2023, 4:44 PM

## 2023-02-03 NOTE — Progress Notes (Unsigned)
Checking blood in the morning  Readings are 142-180s One was seen in the 200s

## 2023-02-04 ENCOUNTER — Encounter: Payer: Self-pay | Admitting: Nurse Practitioner

## 2023-03-07 ENCOUNTER — Other Ambulatory Visit: Payer: Self-pay | Admitting: Physician Assistant

## 2023-03-07 DIAGNOSIS — M791 Myalgia, unspecified site: Secondary | ICD-10-CM

## 2023-03-18 ENCOUNTER — Other Ambulatory Visit: Payer: Self-pay | Admitting: Physician Assistant

## 2023-03-18 DIAGNOSIS — E1165 Type 2 diabetes mellitus with hyperglycemia: Secondary | ICD-10-CM

## 2023-04-01 ENCOUNTER — Ambulatory Visit: Payer: BC Managed Care – PPO | Admitting: Physician Assistant

## 2023-04-01 NOTE — Progress Notes (Deleted)
 04/01/2023 Christina Fields [id number] April 27, 1960  Referring provider: Claiborne Rigg, NP Primary GI doctor: {acdocs:27040}  ASSESSMENT AND PLAN:   Assessment and Plan              Patient Care Team: Claiborne Rigg, NP as PCP - General (Nurse Practitioner)  HISTORY OF PRESENT ILLNESS: 63 y.o. arabic speaking female referred by Claiborne Rigg, NP, with a past medical history of hypertension, type 2 diabetes with hyperlipidemia, osteoporosis and others listed below presents for evaluation of elevated liver functions.   She body mass index is unknown because there is no height or weight on file..      Latest Ref Rng & Units 01/19/2023    5:10 PM 11/30/2022    3:54 PM 11/13/2022    4:36 PM  Hepatic Function  Total Protein 6.0 - 8.5 g/dL 7.1  6.5  6.9   Albumin 3.9 - 4.9 g/dL 3.9  3.6  4.0   AST 0 - 40 IU/L 19  79  102   ALT 0 - 32 IU/L 26  150  141   Alk Phosphatase 44 - 121 IU/L 94  201  217   Total Bilirubin 0.0 - 1.2 mg/dL <4.7  <8.2  0.3   Bilirubin, Direct 0.00 - 0.40 mg/dL 9.56  <2.13     08/65/7846 RUQ [place]: No gallstones or wall thickening normal CBD at 3.5 unremarkable liver with normal parenchymal echogenicity  Patient had elevated LFTs 10/19/2022 with alk phos 142 previously 82, AST 34 previously 11, ALT 49 previously 12.  These continued to increase with AST max 102, ALT 141, alk phos 217 and began to downtrend with unremarkable liver function 01/19/2023.  She {has/denies:315300}  family history of liver disease. She {Actions; denies-reports:120008} ETOH use. Patient {ACTION; IS/IS NGE:[id number]} a smoker.  {LFTtylenol:26793} She {ACTION; IS/IS GMW:[id number]} on a statin.  {acheprisk:27854}  The patient {Actions; denies-reports:120008} issues with jaundice, scleral icterus, dark urine, clay colored stool.  She {Actions; denies-reports:120008} Abdominal distention, LE edema.  Denies generalized pruritus. She {Actions; denies-reports:120008}  confusion.     Discussed the use of AI scribe software for clinical note transcription with the patient, who gave verbal consent to proceed.  History of Present Illness             She  reports that she has never smoked. She has never used smokeless tobacco. She reports that she does not drink alcohol and does not use drugs.  RELEVANT GI HISTORY, LABS, IMAGING:  CBC    Component Value Date/Time   WBC 6.3 07/13/2022 1547   RBC 4.77 07/13/2022 1547   HGB 13.8 07/13/2022 1547   HCT 42.0 07/13/2022 1547   PLT 228 07/13/2022 1547   MCV 88 07/13/2022 1547   MCH 28.9 07/13/2022 1547   MCHC 32.9 07/13/2022 1547   RDW 13.2 07/13/2022 1547   LYMPHSABS 2.0 07/13/2022 1547   EOSABS 0.5 (H) 07/13/2022 1547   BASOSABS 0.1 07/13/2022 1547   Recent Labs    04/13/22 1618 07/13/22 1547  HGB 14.7 13.8    CMP     Component Value Date/Time   NA 140 11/13/2022 1636   K 4.8 11/13/2022 1636   CL 104 11/13/2022 1636   CO2 22 11/13/2022 1636   GLUCOSE 141 (H) 11/13/2022 1636   BUN 14 11/13/2022 1636   CREATININE 0.74 11/13/2022 1636   CALCIUM 9.4 11/13/2022 1636   PROT 7.1 01/19/2023 1710   ALBUMIN 3.9 01/19/2023 1710  AST 19 01/19/2023 1710   ALT 26 01/19/2023 1710   ALKPHOS 94 01/19/2023 1710   BILITOT <0.2 01/19/2023 1710   GFRNONAA 73 10/27/2019 1600   GFRAA 84 10/27/2019 1600      Latest Ref Rng & Units 01/19/2023    5:10 PM 11/30/2022    3:54 PM 11/13/2022    4:36 PM  Hepatic Function  Total Protein 6.0 - 8.5 g/dL 7.1  6.5  6.9   Albumin 3.9 - 4.9 g/dL 3.9  3.6  4.0   AST 0 - 40 IU/L 19  79  102   ALT 0 - 32 IU/L 26  150  141   Alk Phosphatase 44 - 121 IU/L 94  201  217   Total Bilirubin 0.0 - 1.2 mg/dL <1.6  <1.0  0.3   Bilirubin, Direct 0.00 - 0.40 mg/dL 9.60  <4.54        Current Medications:   Current Outpatient Medications (Endocrine & Metabolic):    dapagliflozin propanediol (FARXIGA) 10 MG TABS tablet, TAKE 1 TABLET BY MOUTH EVERY DAY BEFORE BREAKFAST    metFORMIN (GLUCOPHAGE) 500 MG tablet, Take 1 tablet (500 mg total) by mouth 2 (two) times daily with a meal. (Patient not taking: Reported on 02/03/2023)   Semaglutide, 1 MG/DOSE, 4 MG/3ML SOPN, Inject 1 mg as directed once a week. (Patient not taking: Reported on 02/03/2023)  Current Outpatient Medications (Cardiovascular):    atorvastatin (LIPITOR) 40 MG tablet, TAKE 1 TABLET BY MOUTH EVERY DAY   lisinopril (ZESTRIL) 10 MG tablet, TAKE 1 TABLET BY MOUTH EVERY DAY   Current Outpatient Medications (Analgesics):    meloxicam (MOBIC) 7.5 MG tablet, TAKE 1 TABLET (7.5 MG TOTAL) BY MOUTH DAILY. AS NEEDED PAIN   Current Outpatient Medications (Other):    Accu-Chek Softclix Lancets lancets, Check blood glucose level by fingerstick twice per day. E11.65   cyclobenzaprine (FLEXERIL) 5 MG tablet, Take 1 tablet (5 mg total) by mouth at bedtime as needed for muscle spasms. (Patient not taking: Reported on 02/03/2023)   glucose blood (ACCU-CHEK GUIDE) test strip, USE TO CHECK BLOOD GLUCOSE BY FINGERSTICK TWICE A DAY   Lancet Devices (MICROLET NEXT LANCING DEVICE) MISC, 1 Device by Does not apply route daily.   omeprazole (PRILOSEC) 20 MG capsule, Take 1 capsule (20 mg total) by mouth daily. (Patient not taking: Reported on 02/03/2023)   Vitamin D, Ergocalciferol, (DRISDOL) 1.25 MG (50000 UNIT) CAPS capsule, Take 1 capsule (50,000 Units total) by mouth every 7 (seven) days. Please fill as a 90 day supply  Medical History:  Past Medical History:  Diagnosis Date   Diabetes mellitus without complication (HCC)    Hyperlipidemia    Hypertension    Allergies: No Known Allergies   Surgical History:  She  has a past surgical history that includes Ankle surgery (2004) and Cesarean section. Family History:  Her family history includes Diabetes in her mother.  REVIEW OF SYSTEMS  : All other systems reviewed and negative except where noted in the History of Present Illness.  PHYSICAL EXAM: LMP  (LMP  Unknown)  General Appearance: Well nourished, in no apparent distress. Head:   Normocephalic and atraumatic. Eyes:  sclerae anicteric,conjunctive pink  Respiratory: Respiratory effort normal, BS equal bilaterally without rales, rhonchi, wheezing. Cardio: RRR with no MRGs. Peripheral pulses intact.  Abdomen: Soft,  {BlankSingle:19197::"Flat","Obese","Non-distended"} ,active bowel sounds. {actendernessAB:27319} tenderness {anatomy; site abdomen:5010}. {BlankMultiple:19196::"Without guarding","With guarding","Without rebound","With rebound"}. No masses. Rectal: {acrectalexam:27461} Musculoskeletal: Full ROM, {PSY - GAIT AND STATION:22860} gait. {With/Without:[id number]}  edema. Skin:  Dry and intact without significant lesions or rashes Neuro: Alert and  oriented x4;  No focal deficits. Psych:  Cooperative. Normal mood and affect.    Doree Albee, PA-C 8:31 AM

## 2023-04-07 ENCOUNTER — Other Ambulatory Visit: Payer: Self-pay | Admitting: Nurse Practitioner

## 2023-04-07 DIAGNOSIS — M791 Myalgia, unspecified site: Secondary | ICD-10-CM

## 2023-04-08 ENCOUNTER — Other Ambulatory Visit: Payer: Self-pay | Admitting: Family Medicine

## 2023-04-08 DIAGNOSIS — I10 Essential (primary) hypertension: Secondary | ICD-10-CM

## 2023-04-08 NOTE — Telephone Encounter (Signed)
Requested by interface surescripts. Future visit in 4 weeks.  Requested Prescriptions  Pending Prescriptions Disp Refills   lisinopril (ZESTRIL) 10 MG tablet [Pharmacy Med Name: LISINOPRIL 10 MG TABLET] 90 tablet 0    Sig: TAKE 1 TABLET BY MOUTH EVERY DAY     Cardiovascular:  ACE Inhibitors Passed - 04/08/2023 12:00 PM      Passed - Cr in normal range and within 180 days    Creatinine, Ser  Date Value Ref Range Status  11/13/2022 0.74 0.57 - 1.00 mg/dL Final         Passed - K in normal range and within 180 days    Potassium  Date Value Ref Range Status  11/13/2022 4.8 3.5 - 5.2 mmol/L Final         Passed - Patient is not pregnant      Passed - Last BP in normal range    BP Readings from Last 1 Encounters:  02/03/23 104/68         Passed - Valid encounter within last 6 months    Recent Outpatient Visits           2 months ago Type 2 diabetes mellitus with hyperglycemia, without long-term current use of insulin (HCC)   [facility] Dept Of [place]. [facility] [place], [place] W, NP   2 months ago Type 2 diabetes mellitus with hyperglycemia, without long-term current use of insulin (HCC)   [facility] - [facility] Dept Of [place]. [facility] [place], [place] W, NP   5 months ago Type 2 diabetes mellitus with hyperglycemia, without long-term current use of insulin (HCC)   [facility] - A Dept Of [place]. [facility] [place], [place] W, NP   7 months ago Type 2 diabetes mellitus with hyperglycemia, without long-term current use of insulin (HCC)   [facility] - A Dept Of [place]. [facility], [place], [place]   8 months ago Type 2 diabetes mellitus with hyperglycemia, without long-term current use of insulin [facility])   [facility] - A Dept Of [place]. [facility] Claiborne Rigg, NP       Future Appointments             In 4  weeks Claiborne Rigg, NP [facility] - A Dept Of [place]. [facility]

## 2023-05-07 ENCOUNTER — Ambulatory Visit: Payer: BC Managed Care – PPO | Attending: Nurse Practitioner | Admitting: Nurse Practitioner

## 2023-05-07 ENCOUNTER — Encounter: Payer: Self-pay | Admitting: Nurse Practitioner

## 2023-05-07 VITALS — BP 133/89 | HR 62 | Resp 19 | Ht 69.0 in | Wt 224.0 lb

## 2023-05-07 DIAGNOSIS — I10 Essential (primary) hypertension: Secondary | ICD-10-CM | POA: Diagnosis not present

## 2023-05-07 DIAGNOSIS — E7800 Pure hypercholesterolemia, unspecified: Secondary | ICD-10-CM | POA: Diagnosis not present

## 2023-05-07 DIAGNOSIS — E119 Type 2 diabetes mellitus without complications: Secondary | ICD-10-CM

## 2023-05-07 DIAGNOSIS — R7989 Other specified abnormal findings of blood chemistry: Secondary | ICD-10-CM | POA: Diagnosis not present

## 2023-05-07 DIAGNOSIS — Z7984 Long term (current) use of oral hypoglycemic drugs: Secondary | ICD-10-CM | POA: Diagnosis not present

## 2023-05-07 LAB — POCT GLYCOSYLATED HEMOGLOBIN (HGB A1C): Hemoglobin A1C: 9.1 % — AB (ref 4.0–5.6)

## 2023-05-07 MED ORDER — METFORMIN HCL ER 750 MG PO TB24: 750.0000 mg | ORAL_TABLET | Freq: Every day | ORAL | 1 refills | Status: DC

## 2023-05-07 MED ORDER — INSULIN GLARGINE 100 UNIT/ML SOLOSTAR PEN: 20.0000 [IU] | PEN_INJECTOR | Freq: Every day | SUBCUTANEOUS | 11 refills | Status: DC

## 2023-05-07 NOTE — Progress Notes (Signed)
 Assessment & Plan:  Christina Fields was seen today for medical management of chronic issues.  Diagnoses and all orders for this visit:  Diabetes mellitus treated with oral medication  Restart metformin. Return for labs in 2 weeks. Start insulin.  -     POCT glycosylated hemoglobin (Hb A1C) -     metFORMIN (GLUCOPHAGE-XR) 750 MG 24 hr tablet; Take 1 tablet (750 mg total) by mouth daily with breakfast. -     insulin glargine (LANTUS) 100 UNIT/ML Solostar Pen; Inject 20 Units into the skin daily at 10 pm.  Essential hypertension Continue lisinopril as prescribed.  Hypercholesterolemia -     Cancel: Lipid panel -     Lipid panel; Future  Elevated LFTs -     Hepatic Function Panel; Future -     Lipid panel; Future    Patient has been counseled on age-appropriate routine health concerns for screening and prevention. These are reviewed and up-to-date. Referrals have been placed accordingly. Immunizations are up-to-date or declined.    Subjective:   Chief Complaint  Patient presents with   Medical Management of Chronic Issues    Christina Fields MO Tinnel 63 y.o. female presents to office today for follow up to DM and HTN  She has a past medical history of DM2, Hyperlipidemia, and Hypertension.    HTN Blood pressure is well controlled. Currently taking lisinopril 10 mg daily.  BP Readings from Last 3 Encounters:  05/07/23 133/89  02/03/23 104/68  01/19/23 123/76    DM 2 Diabetes is poorly controlled. We had a long discussion regarding her diabetes. She continues to decline insulin however I feel it is best to go ahead and prescribe this for her today. We stopped metformin  and ozempic last year due to elevated LFT. She is currently only taking farxiga 10 mg daily.  Lab Results  Component Value Date   HGBA1C 9.1 (A) 05/07/2023      Review of Systems  Constitutional:  Negative for fever, malaise/fatigue and weight loss.  HENT: Negative.  Negative for nosebleeds.   Eyes:  Negative.  Negative for blurred vision, double vision and photophobia.  Respiratory: Negative.  Negative for cough and shortness of breath.   Cardiovascular: Negative.  Negative for chest pain, palpitations and leg swelling.  Gastrointestinal: Negative.  Negative for heartburn, nausea and vomiting.  Musculoskeletal: Negative.  Negative for myalgias.  Neurological: Negative.  Negative for dizziness, focal weakness, seizures and headaches.  Psychiatric/Behavioral: Negative.  Negative for suicidal ideas.     Past Medical History:  Diagnosis Date   Diabetes mellitus without complication (HCC)    Hyperlipidemia    Hypertension     Past Surgical History:  Procedure Laterality Date   ANKLE SURGERY  2004   CESAREAN SECTION     x2    Family History  Problem Relation Age of Onset   Diabetes Mother    Breast cancer Neg Hx     Social History Reviewed with no changes to be made today.   Outpatient Medications Prior to Visit  Medication Sig Dispense Refill   cyclobenzaprine (FLEXERIL) 5 MG tablet Take 1 tablet (5 mg total) by mouth at bedtime as needed for muscle spasms. 30 tablet 3   dapagliflozin propanediol (FARXIGA) 10 MG TABS tablet TAKE 1 TABLET BY MOUTH EVERY DAY BEFORE BREAKFAST 90 tablet 0   lisinopril (ZESTRIL) 10 MG tablet TAKE 1 TABLET BY MOUTH EVERY DAY 90 tablet 0   Accu-Chek Softclix Lancets lancets Check blood glucose level by  fingerstick twice per day. E11.65 (Patient not taking: Reported on 05/07/2023) 100 each 2   atorvastatin (LIPITOR) 40 MG tablet TAKE 1 TABLET BY MOUTH EVERY DAY 90 tablet 1   glucose blood (ACCU-CHEK GUIDE) test strip USE TO CHECK BLOOD GLUCOSE BY FINGERSTICK TWICE A DAY (Patient not taking: Reported on 05/07/2023) 200 strip 1   meloxicam (MOBIC) 7.5 MG tablet TAKE 1 TABLET (7.5 MG TOTAL) BY MOUTH DAILY. AS NEEDED PAIN (Patient not taking: Reported on 05/07/2023) 30 tablet 0   omeprazole (PRILOSEC) 20 MG capsule Take 1 capsule (20 mg total) by mouth  daily. (Patient not taking: Reported on 05/07/2023) 90 capsule 3   Vitamin D, Ergocalciferol, (DRISDOL) 1.25 MG (50000 UNIT) CAPS capsule Take 1 capsule (50,000 Units total) by mouth every 7 (seven) days. Please fill as a 90 day supply (Patient not taking: Reported on 05/07/2023) 12 capsule 0   Lancet Devices (MICROLET NEXT LANCING DEVICE) MISC 1 Device by Does not apply route daily. (Patient not taking: Reported on 05/07/2023) 1 each 0   metFORMIN (GLUCOPHAGE) 500 MG tablet Take 1 tablet (500 mg total) by mouth 2 (two) times daily with a meal. (Patient not taking: Reported on 05/07/2023) 180 tablet 3   Semaglutide, 1 MG/DOSE, 4 MG/3ML SOPN Inject 1 mg as directed once a week. (Patient not taking: Reported on 05/07/2023) 3 mL 1   No facility-administered medications prior to visit.    No Known Allergies     Objective:    BP 133/89 (BP Location: Left Arm, Patient Position: Sitting, Cuff Size: Normal)   Pulse 62   Resp 19   Ht 5\' 9"  (1.753 m)   Wt 224 lb (101.6 kg)   LMP  (LMP Unknown)   SpO2 99%   BMI 33.08 kg/m  Wt Readings from Last 3 Encounters:  05/07/23 224 lb (101.6 kg)  02/03/23 217 lb (98.4 kg)  01/19/23 218 lb 6.4 oz (99.1 kg)    Physical Exam Vitals and nursing note reviewed.  Constitutional:      Appearance: She is well-developed.  HENT:     Head: Normocephalic and atraumatic.  Cardiovascular:     Rate and Rhythm: Normal rate and regular rhythm.     Heart sounds: Normal heart sounds. No murmur heard.    No friction rub. No gallop.  Pulmonary:     Effort: Pulmonary effort is normal. No tachypnea or respiratory distress.     Breath sounds: Normal breath sounds. No decreased breath sounds, wheezing, rhonchi or rales.  Chest:     Chest wall: No tenderness.  Abdominal:     General: Bowel sounds are normal.     Palpations: Abdomen is soft.  Musculoskeletal:        General: Normal range of motion.     Cervical back: Normal range of motion.  Skin:    General: Skin is  warm and dry.  Neurological:     Mental Status: She is alert and oriented to person, place, and time.     Coordination: Coordination normal.  Psychiatric:        Behavior: Behavior normal. Behavior is cooperative.        Thought Content: Thought content normal.        Judgment: Judgment normal.          Patient has been counseled extensively about nutrition and exercise as well as the importance of adherence with medications and regular follow-up. The patient was given clear instructions to go to ER or return to  medical center if symptoms don't improve, worsen or new problems develop. The patient verbalized understanding.   Follow-up: Return for  see me in 4 weeks with meter. Needs labs in 2 weeks. Claiborne Rigg, FNP-BC [facility] and [facility] [place], [place] [phone]   05/07/2023, 6:19 PM

## 2023-05-21 ENCOUNTER — Ambulatory Visit: Payer: BC Managed Care – PPO | Attending: Nurse Practitioner

## 2023-05-21 DIAGNOSIS — E7800 Pure hypercholesterolemia, unspecified: Secondary | ICD-10-CM

## 2023-05-21 DIAGNOSIS — R7989 Other specified abnormal findings of blood chemistry: Secondary | ICD-10-CM

## 2023-05-22 ENCOUNTER — Encounter: Payer: Self-pay | Admitting: Nurse Practitioner

## 2023-05-22 LAB — LIPID PANEL
Chol/HDL Ratio: 4.1 ratio (ref 0.0–4.4)
Cholesterol, Total: 212 mg/dL — ABNORMAL HIGH (ref 100–199)
HDL: 52 mg/dL (ref >39)
LDL Chol Calc (NIH): 141 mg/dL — ABNORMAL HIGH (ref 0–99)
Triglycerides: 106 mg/dL (ref 0–149)
VLDL Cholesterol Cal: 19 mg/dL (ref 5–40)

## 2023-05-22 LAB — HEPATIC FUNCTION PANEL
ALT: 29 IU/L (ref 0–32)
AST: 31 IU/L (ref 0–40)
Albumin: 3.8 g/dL — ABNORMAL LOW (ref 3.9–4.9)
Alkaline Phosphatase: 100 IU/L (ref 44–121)
Bilirubin Total: 0.4 mg/dL (ref 0.0–1.2)
Bilirubin, Direct: 0.11 mg/dL (ref 0.00–0.40)
Total Protein: 6.5 g/dL (ref 6.0–8.5)

## 2023-06-11 ENCOUNTER — Ambulatory Visit: Payer: BC Managed Care – PPO | Admitting: Nurse Practitioner

## 2023-06-16 ENCOUNTER — Other Ambulatory Visit: Payer: Self-pay | Admitting: Family Medicine

## 2023-06-16 DIAGNOSIS — E1165 Type 2 diabetes mellitus with hyperglycemia: Secondary | ICD-10-CM

## 2023-07-04 ENCOUNTER — Other Ambulatory Visit: Payer: Self-pay | Admitting: Nurse Practitioner

## 2023-07-04 DIAGNOSIS — I10 Essential (primary) hypertension: Secondary | ICD-10-CM

## 2023-07-06 NOTE — Telephone Encounter (Signed)
 Requested Prescriptions  Pending Prescriptions Disp Refills   lisinopril  (ZESTRIL ) 10 MG tablet [Pharmacy Med Name: LISINOPRIL  10 MG TABLET] 90 tablet 0    Sig: TAKE 1 TABLET BY MOUTH EVERY DAY     Cardiovascular:  ACE Inhibitors Failed - 07/06/2023 11:02 AM      Failed - Cr in normal range and within 180 days    Creatinine, Ser  Date Value Ref Range Status  11/13/2022 0.74 0.57 - 1.00 mg/dL Final         Failed - K in normal range and within 180 days    Potassium  Date Value Ref Range Status  11/13/2022 4.8 3.5 - 5.2 mmol/L Final         Passed - Patient is not pregnant      Passed - Last BP in normal range    BP Readings from Last 1 Encounters:  05/07/23 133/89         Passed - Valid encounter within last 6 months    Recent Outpatient Visits           2 months ago Diabetes mellitus treated with oral medication (HCC)   [place] [facility] [place] [facility] Dept Of [place]. [facility] [place], [place] W, NP   5 months ago Type 2 diabetes mellitus with hyperglycemia, without long-term current use of insulin  [facility])   [place] [facility] - A Dept Of [place]. [facility] [place], [place] W, NP   5 months ago Type 2 diabetes mellitus with hyperglycemia, without long-term current use of insulin  [facility])   [place] [facility] - A Dept Of [place]. [facility] [place], [place] W, NP   8 months ago Type 2 diabetes mellitus with hyperglycemia, without long-term current use of insulin  [facility])   [place] [facility] - A Dept Of [place]. [facility] [place], [place] W, NP   10 months ago Type 2 diabetes mellitus with hyperglycemia, without long-term current use of insulin  [facility])   [place] [facility] - A Dept Of . [facility] [place], [place], PA-C

## 2023-07-27 ENCOUNTER — Ambulatory Visit: Admitting: Nurse Practitioner

## 2023-08-23 ENCOUNTER — Ambulatory Visit: Admitting: Nurse Practitioner

## 2023-08-24 ENCOUNTER — Other Ambulatory Visit: Payer: Self-pay | Admitting: Nurse Practitioner

## 2023-08-24 DIAGNOSIS — Z1231 Encounter for screening mammogram for malignant neoplasm of breast: Secondary | ICD-10-CM

## 2023-08-25 ENCOUNTER — Telehealth: Payer: Self-pay

## 2023-08-25 DIAGNOSIS — R319 Hematuria, unspecified: Secondary | ICD-10-CM

## 2023-08-25 NOTE — Addendum Note (Signed)
 Addended by: Concetta Dee B on: 08/25/2023 06:12 PM   Modules accepted: Orders

## 2023-08-25 NOTE — Telephone Encounter (Signed)
 Copied from CRM [id number]. Topic: Clinical - Request for Lab/Test Order >> Aug 25, 2023  2:45 PM Sophia H wrote: Reason for CRM: Patients daughter is calling in on behalf of the patient. States she was not feeling well last week so they took the patient to urgent care. There was blood found in urine but when the sample was sent off it was lost. Patients daughter is wanting to know if orders can be put in for a urine sample to try and figure out what is going on. They do not want her to have to repeat through the urgent care since they've already lost one sample. States patient has been feeling better but unsure of what the blood in urine would've meant since that sample was lost. Please advise, # [phone] Alaa (Daughter)    ----------------------------------------------------------------------- From previous Reason for Contact - Medical Advice: Reason for CRM:

## 2023-08-25 NOTE — Telephone Encounter (Signed)
 Pt can come to lab to give urine sample. Order entered.

## 2023-08-26 NOTE — Telephone Encounter (Signed)
 Called patient using a Language Line interpreter, Lujain [phone], and LVM informing that she is able to come in to leave a urine sample at lab during business hours.

## 2023-08-31 ENCOUNTER — Ambulatory Visit: Attending: Nurse Practitioner

## 2023-08-31 DIAGNOSIS — R319 Hematuria, unspecified: Secondary | ICD-10-CM

## 2023-09-01 ENCOUNTER — Ambulatory Visit: Payer: Self-pay | Admitting: Internal Medicine

## 2023-09-01 LAB — URINALYSIS, ROUTINE W REFLEX MICROSCOPIC
Bilirubin, UA: NEGATIVE
Ketones, UA: NEGATIVE
Leukocytes,UA: NEGATIVE
Nitrite, UA: NEGATIVE
Protein,UA: NEGATIVE
RBC, UA: NEGATIVE
Specific Gravity, UA: >=1.03 — AB (ref 1.005–1.030)
Urobilinogen, Ur: 0.2 mg/dL (ref 0.2–1.0)
pH, UA: 7 (ref 5.0–7.5)

## 2023-09-09 ENCOUNTER — Ambulatory Visit
Admission: RE | Admit: 2023-09-09 | Discharge: 2023-09-09 | Disposition: A | Discharge status: Home / Self Care | Source: Ambulatory Visit | Attending: Nurse Practitioner | Admitting: Nurse Practitioner

## 2023-09-09 DIAGNOSIS — Z1231 Encounter for screening mammogram for malignant neoplasm of breast: Secondary | ICD-10-CM

## 2023-09-14 ENCOUNTER — Ambulatory Visit: Payer: Self-pay | Admitting: Nurse Practitioner

## 2023-09-28 ENCOUNTER — Telehealth: Payer: Self-pay | Admitting: Nurse Practitioner

## 2023-09-28 NOTE — Telephone Encounter (Signed)
 Pt confirmed appt

## 2023-09-29 ENCOUNTER — Encounter: Payer: Self-pay | Admitting: Nurse Practitioner

## 2023-09-29 ENCOUNTER — Ambulatory Visit: Attending: Nurse Practitioner | Admitting: Nurse Practitioner

## 2023-09-29 VITALS — BP 131/85 | HR 57 | Resp 20 | Ht 69.0 in | Wt 213.4 lb

## 2023-09-29 DIAGNOSIS — E559 Vitamin D deficiency, unspecified: Secondary | ICD-10-CM

## 2023-09-29 DIAGNOSIS — Z7984 Long term (current) use of oral hypoglycemic drugs: Secondary | ICD-10-CM | POA: Diagnosis not present

## 2023-09-29 DIAGNOSIS — Z23 Encounter for immunization: Secondary | ICD-10-CM

## 2023-09-29 DIAGNOSIS — I10 Essential (primary) hypertension: Secondary | ICD-10-CM | POA: Diagnosis not present

## 2023-09-29 DIAGNOSIS — Z794 Long term (current) use of insulin: Secondary | ICD-10-CM | POA: Diagnosis not present

## 2023-09-29 DIAGNOSIS — E119 Type 2 diabetes mellitus without complications: Secondary | ICD-10-CM | POA: Diagnosis not present

## 2023-09-29 DIAGNOSIS — R7989 Other specified abnormal findings of blood chemistry: Secondary | ICD-10-CM

## 2023-09-29 DIAGNOSIS — M791 Myalgia, unspecified site: Secondary | ICD-10-CM

## 2023-09-29 LAB — POCT GLYCOSYLATED HEMOGLOBIN (HGB A1C): Hemoglobin A1C: 10.1 % — AB (ref 4.0–5.6)

## 2023-09-29 MED ORDER — DEXCOM G7 RECEIVER DEVI: 0 refills | Status: DC

## 2023-09-29 MED ORDER — LISINOPRIL 10 MG PO TABS
10.0000 mg | ORAL_TABLET | Freq: Every day | ORAL | 0 refills | Status: DC
Start: 2023-09-29 — End: 2024-01-07

## 2023-09-29 MED ORDER — MELOXICAM 7.5 MG PO TABS
7.5000 mg | ORAL_TABLET | Freq: Every day | ORAL | 6 refills | Status: AC
Start: 2023-09-29 — End: ?

## 2023-09-29 MED ORDER — INSULIN GLARGINE 100 UNIT/ML SOLOSTAR PEN: 20.0000 [IU] | PEN_INJECTOR | Freq: Every day | SUBCUTANEOUS | 11 refills | Status: AC

## 2023-09-29 MED ORDER — DEXCOM G7 SENSOR MISC: 6 refills | Status: DC

## 2023-09-29 NOTE — Progress Notes (Signed)
 Assessment & Plan:  Christina Fields was seen today for diabetes.  Diagnoses and all orders for this visit:  Diabetes mellitus treated with insulin  and oral medication  Encouraged to walk on her treadmill 3-4 times per week for at least 30 minutes. -     POCT glycosylated hemoglobin (Hb A1C) -     Urine Albumin/Creatinine with ratio (send out) [LAB689] -     CMP14+EGFR -     Continuous Glucose Sensor (DEXCOM G7 SENSOR) MISC; Check blood glucose levels continuously E11.65 Z79.84 -     Continuous Glucose Receiver (DEXCOM G7 RECEIVER) DEVI; Check blood glucose levels continuously E11.65 Z79.84 -     insulin  glargine (LANTUS ) 100 UNIT/ML Solostar Pen; Inject 20 Units into the skin daily at 10 pm.  Essential hypertension -     lisinopril  (ZESTRIL ) 10 MG tablet; Take 1 tablet (10 mg total) by mouth daily. -     CMP14+EGFR Continue all antihypertensives as prescribed.  Reminded to bring in blood pressure log for follow  up appointment.  RECOMMENDATIONS: DASH/Mediterranean Diets are healthier choices for HTN.    Myalgia Symptoms are well-controlled and manageable -     meloxicam  (MOBIC ) 7.5 MG tablet; Take 1 tablet (7.5 mg total) by mouth daily.  Abnormal CBC -     CBC with Differential  Vitamin D  deficiency -     VITAMIN D  25 Hydroxy (Vit-D Deficiency, Fractures)  Need for pneumococcal 20-valent conjugate vaccination -     Pneumococcal conjugate vaccine 20-valent    Patient has been counseled on age-appropriate routine health concerns for screening and prevention. These are reviewed and up-to-date. Referrals have been placed accordingly. Immunizations are up-to-date or declined.    Subjective:   Chief Complaint  Patient presents with   Diabetes    Christina Fields MO Smay 63 y.o. female presents to office today for follow up to DM and HTN. She is accompanied by her blood pressure   She has a PMH of DM, HTN, HPL   DM 2 Diabetes is poorly controlled.  She has diabetic  retinopathy.  I prescribed her insulin  at her last visit however she never picked this up.  She is currently taking metformin  750 mg daily (could not tolerate higher doses due to GI upset) and Farxiga  10 mg daily.  We had a long discussion today regarding complications of diabetes and ways to manage diet and exercise.  She is agreeable to start insulin  and I was able to apply a Dexcom sensor to her left arm today and her daughter was able to upload the app to her smart phone Lab Results  Component Value Date   HGBA1C 10.1 (A) 09/29/2023    Lab Results  Component Value Date   HGBA1C 9.1 (A) 05/07/2023     HTN Blood pressure well contorlled with lisinopril  10 mg daily BP Readings from Last 3 Encounters:  09/29/23 131/85  05/07/23 133/89  02/03/23 104/68     Review of Systems  Constitutional:  Negative for fever, malaise/fatigue and weight loss.  HENT: Negative.  Negative for nosebleeds.   Eyes: Negative.  Negative for blurred vision, double vision and photophobia.  Respiratory: Negative.  Negative for cough and shortness of breath.   Cardiovascular: Negative.  Negative for chest pain, palpitations and leg swelling.  Gastrointestinal: Negative.  Negative for heartburn, nausea and vomiting.  Musculoskeletal: Negative.  Negative for myalgias.  Neurological: Negative.  Negative for dizziness, focal weakness, seizures and headaches.  Psychiatric/Behavioral: Negative.  Negative for suicidal ideas.  Past Medical History:  Diagnosis Date   Diabetes mellitus without complication (HCC)    Hyperlipidemia    Hypertension     Past Surgical History:  Procedure Laterality Date   ANKLE SURGERY  2004   CESAREAN SECTION     x2    Family History  Problem Relation Age of Onset   Diabetes Mother    Breast cancer Niece 71   BRCA 1/2 Neg Hx     Social History Reviewed with no changes to be made today.   Outpatient Medications Prior to Visit  Medication Sig Dispense Refill   Accu-Chek  Softclix Lancets lancets Check blood glucose level by fingerstick twice per day. E11.65 100 each 2   atorvastatin  (LIPITOR) 40 MG tablet TAKE 1 TABLET BY MOUTH EVERY DAY 90 tablet 1   cyclobenzaprine  (FLEXERIL ) 5 MG tablet Take 1 tablet (5 mg total) by mouth at bedtime as needed for muscle spasms. 30 tablet 3   dapagliflozin  propanediol (FARXIGA ) 10 MG TABS tablet TAKE 1 TABLET BY MOUTH EVERY DAY BEFORE BREAKFAST 90 tablet 1   glucose blood (ACCU-CHEK GUIDE) test strip USE TO CHECK BLOOD GLUCOSE BY FINGERSTICK TWICE A DAY 200 strip 1   metFORMIN  (GLUCOPHAGE -XR) 750 MG 24 hr tablet Take 1 tablet (750 mg total) by mouth daily with breakfast. 90 tablet 1   omeprazole  (PRILOSEC) 20 MG capsule Take 1 capsule (20 mg total) by mouth daily. 90 capsule 3   lisinopril  (ZESTRIL ) 10 MG tablet TAKE 1 TABLET BY MOUTH EVERY DAY 90 tablet 0   meloxicam  (MOBIC ) 7.5 MG tablet TAKE 1 TABLET (7.5 MG TOTAL) BY MOUTH DAILY. AS NEEDED PAIN 30 tablet 0   Vitamin D , Ergocalciferol , (DRISDOL ) 1.25 MG (50000 UNIT) CAPS capsule Take 1 capsule (50,000 Units total) by mouth every 7 (seven) days. Please fill as a 90 day supply (Patient not taking: Reported on 09/29/2023) 12 capsule 0   insulin  glargine (LANTUS ) 100 UNIT/ML Solostar Pen Inject 20 Units into the skin daily at 10 pm. (Patient not taking: Reported on 09/29/2023) 15 mL 11   No facility-administered medications prior to visit.    No Known Allergies     Objective:    BP 131/85 (BP Location: Left Arm, Patient Position: Sitting, Cuff Size: Normal)   Pulse (!) 57   Resp 20   Ht 5' 9 (1.753 m)   Wt 213 lb 6.4 oz (96.8 kg)   LMP  (LMP Unknown)   SpO2 98%   BMI 31.51 kg/m  Wt Readings from Last 3 Encounters:  09/29/23 213 lb 6.4 oz (96.8 kg)  05/07/23 224 lb (101.6 kg)  02/03/23 217 lb (98.4 kg)    Physical Exam Vitals and nursing note reviewed.  Constitutional:      Appearance: She is well-developed.  HENT:     Head: Normocephalic and atraumatic.   Cardiovascular:     Rate and Rhythm: Regular rhythm. Bradycardia present.     Heart sounds: Normal heart sounds. No murmur heard.    No friction rub. No gallop.  Pulmonary:     Effort: Pulmonary effort is normal. No tachypnea or respiratory distress.     Breath sounds: Normal breath sounds. No decreased breath sounds, wheezing, rhonchi or rales.  Chest:     Chest wall: No tenderness.  Abdominal:     General: Bowel sounds are normal.     Palpations: Abdomen is soft.  Musculoskeletal:        General: Normal range of motion.  Cervical back: Normal range of motion.  Skin:    General: Skin is warm and dry.  Neurological:     Mental Status: She is alert and oriented to person, place, and time.     Coordination: Coordination normal.  Psychiatric:        Behavior: Behavior normal. Behavior is cooperative.        Thought Content: Thought content normal.        Judgment: Judgment normal.          Patient has been counseled extensively about nutrition and exercise as well as the importance of adherence with medications and regular follow-up. The patient was given clear instructions to go to ER or return to medical center if symptoms don't improve, worsen or new problems develop. The patient verbalized understanding.   Follow-up: Return in about 3 months (around 01/04/2024).   Christina LELON Servant, FNP-BC [facility] and [facility] [place], [place] [phone]   09/29/2023, 12:07 PM

## 2023-10-01 LAB — CBC WITH DIFFERENTIAL/PLATELET
Basophils Absolute: 0.1 x10E3/uL (ref 0.0–0.2)
Basos: 1 %
EOS (ABSOLUTE): 0.2 x10E3/uL (ref 0.0–0.4)
Eos: 4 %
Hematocrit: 44.6 % (ref 34.0–46.6)
Hemoglobin: 14.5 g/dL (ref 11.1–15.9)
Immature Grans (Abs): 0 x10E3/uL (ref 0.0–0.1)
Immature Granulocytes: 0 %
Lymphocytes Absolute: 1.9 x10E3/uL (ref 0.7–3.1)
Lymphs: 30 %
MCH: 30.7 pg (ref 26.6–33.0)
MCHC: 32.5 g/dL (ref 31.5–35.7)
MCV: 94 fL (ref 79–97)
Monocytes Absolute: 0.3 x10E3/uL (ref 0.1–0.9)
Monocytes: 5 %
Neutrophils Absolute: 3.9 x10E3/uL (ref 1.4–7.0)
Neutrophils: 60 %
Platelets: 191 x10E3/uL (ref 150–450)
RBC: 4.73 x10E6/uL (ref 3.77–5.28)
RDW: 13.4 % (ref 11.7–15.4)
WBC: 6.4 x10E3/uL (ref 3.4–10.8)

## 2023-10-01 LAB — VITAMIN D 25 HYDROXY (VIT D DEFICIENCY, FRACTURES): Vit D, 25-Hydroxy: 19.3 ng/mL — ABNORMAL LOW (ref 30.0–100.0)

## 2023-10-01 LAB — CMP14+EGFR
ALT: 24 IU/L (ref 0–32)
AST: 18 IU/L (ref 0–40)
Albumin: 4 g/dL (ref 3.9–4.9)
Alkaline Phosphatase: 90 IU/L (ref 44–121)
BUN/Creatinine Ratio: 30 — ABNORMAL HIGH (ref 12–28)
BUN: 18 mg/dL (ref 8–27)
Bilirubin Total: 0.3 mg/dL (ref 0.0–1.2)
CO2: 21 mmol/L (ref 20–29)
Calcium: 9.4 mg/dL (ref 8.7–10.3)
Chloride: 102 mmol/L (ref 96–106)
Creatinine, Ser: 0.6 mg/dL (ref 0.57–1.00)
Globulin, Total: 3 g/dL (ref 1.5–4.5)
Glucose: 165 mg/dL — ABNORMAL HIGH (ref 70–99)
Potassium: 4.4 mmol/L (ref 3.5–5.2)
Sodium: 138 mmol/L (ref 134–144)
Total Protein: 7 g/dL (ref 6.0–8.5)
eGFR: 101 mL/min/1.73 (ref >59)

## 2023-10-01 LAB — MICROALBUMIN / CREATININE URINE RATIO
Creatinine, Urine: 42.6 mg/dL
Microalb/Creat Ratio: <7 mg/g{creat} (ref 0–29)
Microalbumin, Urine: <3 ug/mL

## 2023-10-03 ENCOUNTER — Ambulatory Visit: Payer: Self-pay | Admitting: Nurse Practitioner

## 2023-10-03 DIAGNOSIS — E559 Vitamin D deficiency, unspecified: Secondary | ICD-10-CM

## 2023-10-03 MED ORDER — VITAMIN D (ERGOCALCIFEROL) 1.25 MG (50000 UNIT) PO CAPS: 50000.0000 [IU] | ORAL_CAPSULE | ORAL | 0 refills | Status: DC

## 2023-10-07 ENCOUNTER — Other Ambulatory Visit: Payer: Self-pay | Admitting: Nurse Practitioner

## 2023-10-07 DIAGNOSIS — E1169 Type 2 diabetes mellitus with other specified complication: Secondary | ICD-10-CM

## 2023-10-21 ENCOUNTER — Other Ambulatory Visit: Payer: Self-pay | Admitting: Family Medicine

## 2023-10-21 DIAGNOSIS — E1165 Type 2 diabetes mellitus with hyperglycemia: Secondary | ICD-10-CM

## 2023-10-26 ENCOUNTER — Encounter: Payer: Self-pay | Admitting: Nurse Practitioner

## 2023-11-02 ENCOUNTER — Other Ambulatory Visit: Payer: Self-pay | Admitting: Nurse Practitioner

## 2023-11-02 DIAGNOSIS — E119 Type 2 diabetes mellitus without complications: Secondary | ICD-10-CM

## 2023-11-04 NOTE — Telephone Encounter (Signed)
 Requested Prescriptions  Pending Prescriptions Disp Refills   metFORMIN  (GLUCOPHAGE -XR) 750 MG 24 hr tablet [Pharmacy Med Name: METFORMIN  HCL ER 750 MG TABLET] 90 tablet 1    Sig: TAKE 1 TABLET BY MOUTH EVERY DAY WITH BREAKFAST     Endocrinology:  Diabetes - Biguanides Failed - 11/04/2023  1:19 PM      Failed - HBA1C is between 0 and 7.9 and within 180 days    Hemoglobin A1C  Date Value Ref Range Status  09/29/2023 10.1 (A) 4.0 - 5.6 % Final   HbA1c, POC (controlled diabetic range)  Date Value Ref Range Status  07/13/2022 8.1 (A) 0.0 - 7.0 % Final         Failed - B12 Level in normal range and within 720 days    No results found for: VITAMINB12       Passed - Cr in normal range and within 360 days    Creatinine, Ser  Date Value Ref Range Status  09/29/2023 0.60 0.57 - 1.00 mg/dL Final         Passed - eGFR in normal range and within 360 days    GFR calc Af Amer  Date Value Ref Range Status  10/27/2019 84 >59 mL/min/1.73 Final    Comment:    **[facility] currently reports eGFR in compliance with the current**   recommendations of the [employer]. [facility] will   update reporting as new guidelines are published from the NKF-ASN   Task force.    GFR calc non Af Amer  Date Value Ref Range Status  10/27/2019 73 >59 mL/min/1.73 Final   eGFR  Date Value Ref Range Status  09/29/2023 101 >59 mL/min/1.73 Final         Passed - Valid encounter within last 6 months    Recent Outpatient Visits           1 month ago Diabetes mellitus treated with insulin  and oral medication (HCC)   [place] [facility] Dept Of [place]. [facility] Theotis Haze ORN, NP   6 months ago Diabetes mellitus treated with oral medication [facility])   [place] [facility] - [facility] Dept Of [place]. [facility] [place], [place] W, NP   9 months ago Type 2 diabetes mellitus with hyperglycemia, without long-term current use of insulin  [facility])   [place] [facility] - A Dept Of [place]. [facility] [place], [place] W, NP   9 months ago Type 2 diabetes mellitus with hyperglycemia, without long-term current use of insulin  [facility])   [place] [facility] - A Dept Of [place]. [facility] [place], [place] W, NP   1 year ago Type 2 diabetes mellitus with hyperglycemia, without long-term current use of insulin  [facility])    [facility] - A Dept Of [place]. [facility] Theotis Haze ORN, NP       Future Appointments             In 2 months Theotis Haze ORN, NP [facility] - A Dept Of [place]. [facility] [address]            Passed - CBC within normal limits and completed in the last 12 months    WBC  Date Value Ref Range Status  09/29/2023 6.4 3.4 - 10.8 x10E3/uL Final   RBC  Date Value Ref Range Status  09/29/2023 4.73 3.77 -  5.28 x10E6/uL Final   Hemoglobin  Date Value Ref Range Status  09/29/2023 14.5 11.1 - 15.9 g/dL Final   Hematocrit  Date Value Ref Range Status  09/29/2023 44.6 34.0 - 46.6 % Final   MCHC  Date Value Ref Range Status  09/29/2023 32.5 31.5 - 35.7 g/dL Final   [facility]  Date Value Ref Range Status  09/29/2023 30.7 26.6 - 33.0 pg Final   MCV  Date Value Ref Range Status  09/29/2023 94 79 - 97 fL Final   No results found for: PLTCOUNTKUC, LABPLAT, POCPLA RDW  Date Value Ref Range Status  09/29/2023 13.4 11.7 - 15.4 % Final

## 2023-11-13 IMAGING — MG MM DIGITAL SCREENING BILAT W/ TOMO AND CAD
6 of 10 series · 6 of 30 positions shown · non-contrast
Comparison: Previous exam(s).

CLINICAL DATA: Screening.

EXAM:
DIGITAL SCREENING BILATERAL MAMMOGRAM WITH TOMOSYNTHESIS AND CAD
TECHNIQUE: Bilateral screening digital craniocaudal and mediolateral oblique
mammograms were obtained. Bilateral screening digital breast
tomosynthesis was performed. The images were evaluated with
computer-aided detection.

[L MLO synth-2D]
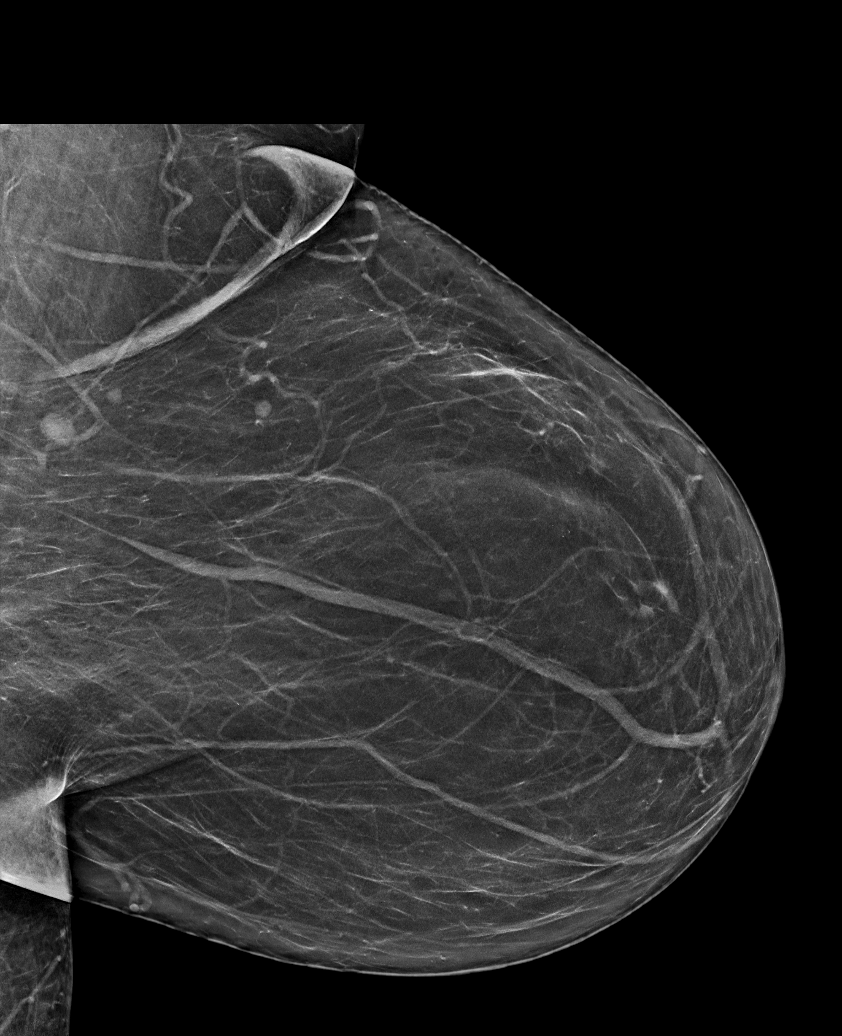

[L CC synth-2D]
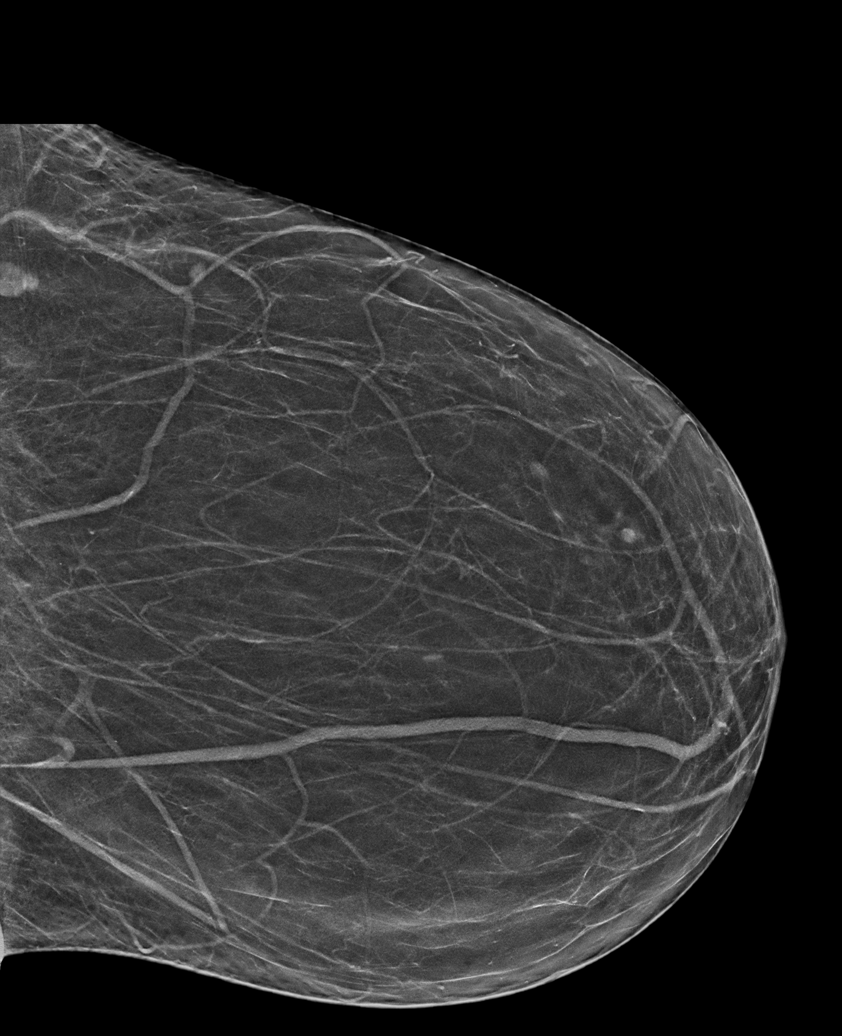

[R CC synth-2D]
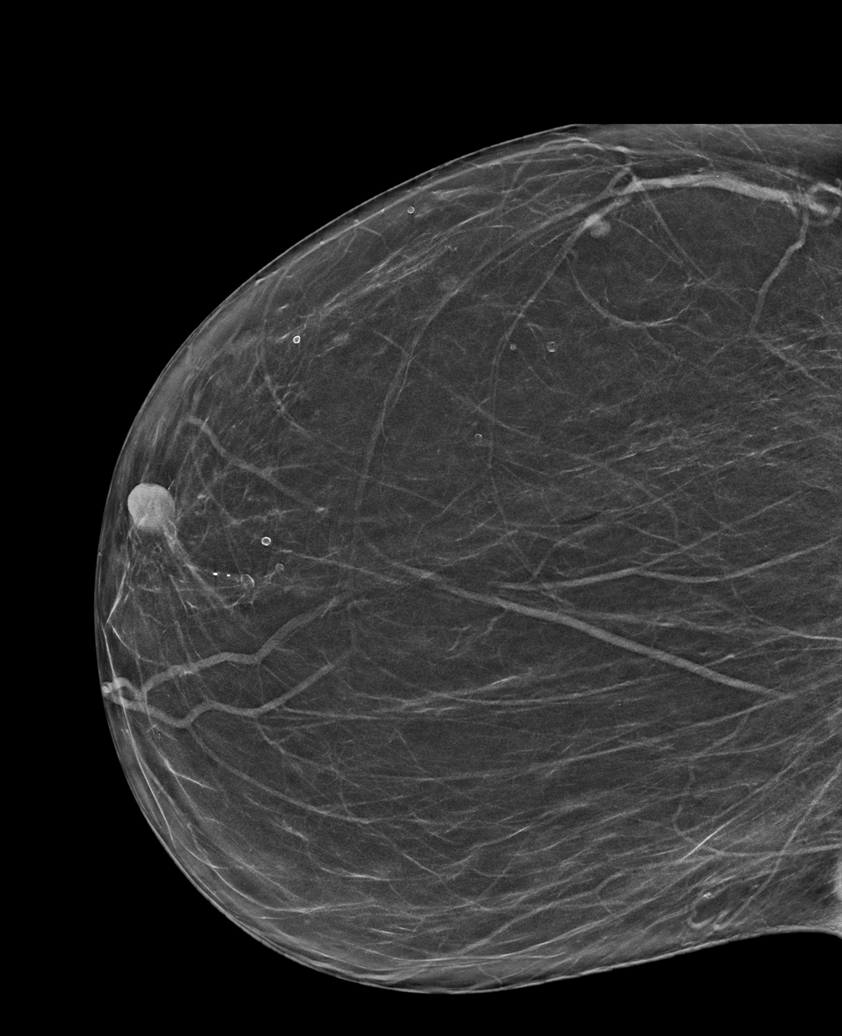

[R MLO synth-2D (1 of 2)]
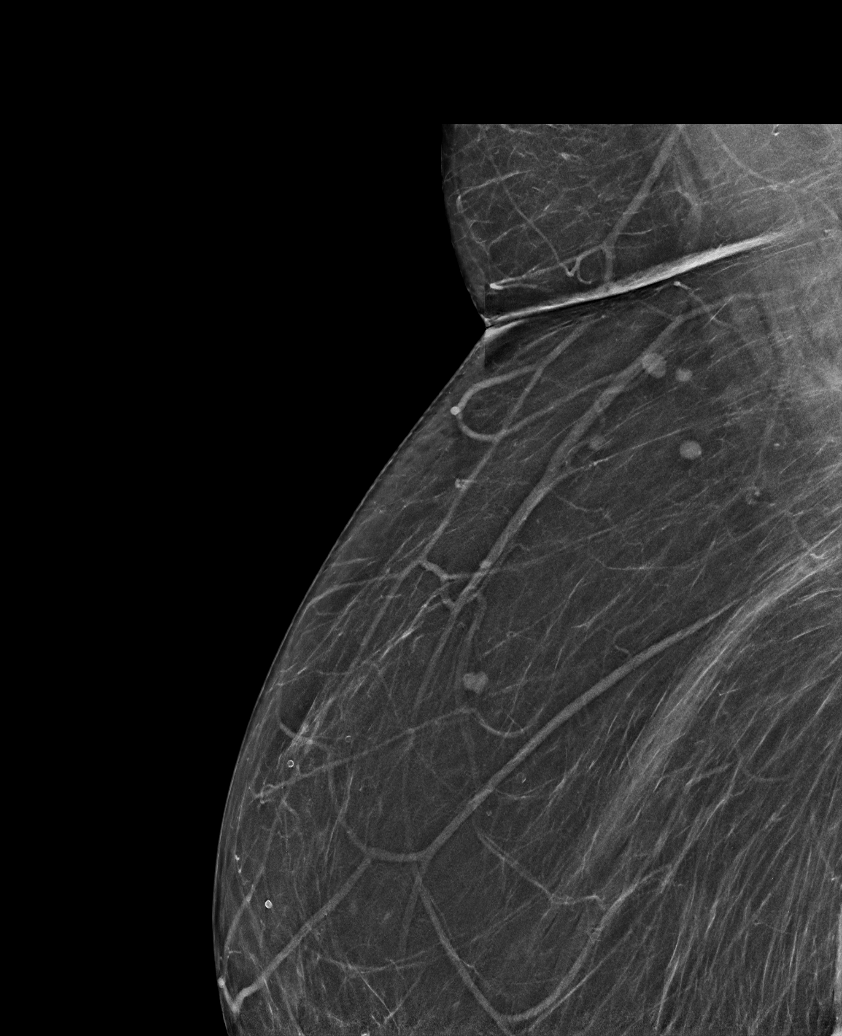

[R MLO synth-2D (2 of 2)]
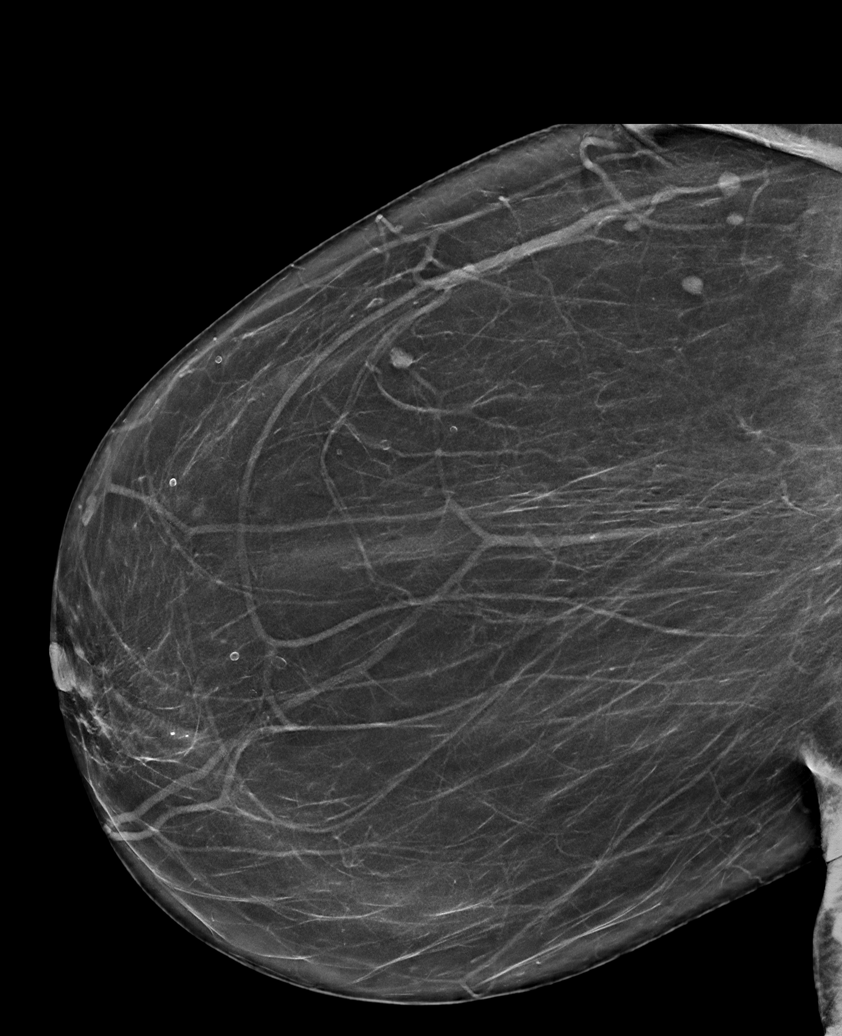

[L CC tomo · tomo slice 32/63.0]
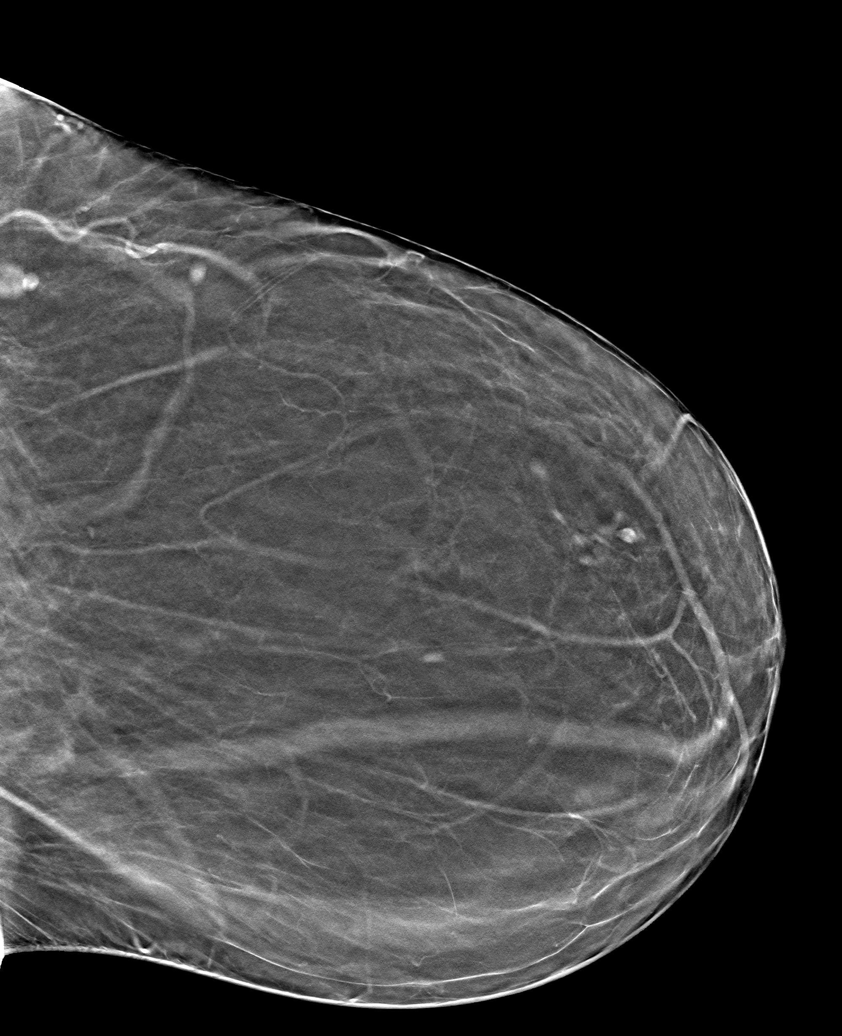

[6 of 30 positions shown; findings below may reference images not displayed]

ACR Breast Density Category b: There are scattered areas of
fibroglandular density.
FINDINGS: There are no findings suspicious for malignancy.
IMPRESSION: No mammographic evidence of malignancy. A result letter of this
screening mammogram will be mailed directly to the patient.

RECOMMENDATION:
Screening mammogram in one year. (Code:51-O-LD2)

BI-RADS CATEGORY  1: Negative.

## 2023-11-28 ENCOUNTER — Other Ambulatory Visit: Payer: Self-pay | Admitting: Nurse Practitioner

## 2023-11-28 DIAGNOSIS — Z7984 Long term (current) use of oral hypoglycemic drugs: Secondary | ICD-10-CM

## 2023-12-02 ENCOUNTER — Other Ambulatory Visit: Payer: Self-pay | Admitting: Nurse Practitioner

## 2023-12-02 DIAGNOSIS — K219 Gastro-esophageal reflux disease without esophagitis: Secondary | ICD-10-CM

## 2023-12-03 NOTE — Telephone Encounter (Signed)
 Requested Prescriptions  Pending Prescriptions Disp Refills   omeprazole  (PRILOSEC) 20 MG capsule [Pharmacy Med Name: OMEPRAZOLE  DR 20 MG CAPSULE] 90 capsule 0    Sig: TAKE 1 CAPSULE BY MOUTH EVERY DAY     Gastroenterology: Proton Pump Inhibitors Passed - 12/03/2023  3:45 PM      Passed - Valid encounter within last 12 months    Recent Outpatient Visits           2 months ago Diabetes mellitus treated with insulin  and oral medication (HCC)   [place] [facility] Dept Of [place]. [facility] Theotis Haze ORN, NP   7 months ago Diabetes mellitus treated with oral medication [facility])   [place] [facility] - A Dept Of [place]. [facility] [place], [place] W, NP   10 months ago Type 2 diabetes mellitus with hyperglycemia, without long-term current use of insulin  [facility])   [place] [facility] - A Dept Of [place]. [facility] [place], [place] W, NP   10 months ago Type 2 diabetes mellitus with hyperglycemia, without long-term current use of insulin  [facility])   [place] [facility] - A Dept Of [place]. [facility] [place], [place] W, NP   1 year ago Type 2 diabetes mellitus with hyperglycemia, without long-term current use of insulin  [facility])   [place] [facility] - A Dept Of [place]. [facility] Theotis Haze ORN, NP       Future Appointments             In 1 month Theotis Haze ORN, NP [facility] - A Dept Of [place]. [facility] [place]

## 2024-01-03 ENCOUNTER — Other Ambulatory Visit: Payer: Self-pay | Admitting: Nurse Practitioner

## 2024-01-03 DIAGNOSIS — E559 Vitamin D deficiency, unspecified: Secondary | ICD-10-CM

## 2024-01-04 ENCOUNTER — Ambulatory Visit: Admitting: Nurse Practitioner

## 2024-01-04 NOTE — Telephone Encounter (Signed)
 Requested medication (s) are due for refill today: Yes  Requested medication (s) are on the active medication list: Yes  Last refill:  10/03/23  Future visit scheduled: Yes  Notes to clinic:  Unable to refill per protocol, cannot delegate.      Requested Prescriptions  Pending Prescriptions Disp Refills   Vitamin D , Ergocalciferol , (DRISDOL ) 1.25 MG (50000 UNIT) CAPS capsule [Pharmacy Med Name: VITAMIN D2 1.25MG (50,000 UNIT)] 12 capsule 0    Sig: Take 1 capsule (50,000 Units total) by mouth every 7 (seven) days. Please fill as a 90 day supply     Endocrinology:  Vitamins - Vitamin D  Supplementation 2 Failed - 01/04/2024  1:22 PM      Failed - Manual Review: Route requests for 50,000 IU strength to the provider      Failed - Vitamin D  in normal range and within 360 days    Vit D, 25-Hydroxy  Date Value Ref Range Status  09/29/2023 19.3 (L) 30.0 - 100.0 ng/mL Final    Comment:    Vitamin D  deficiency has been defined by the Institute of Medicine and an Endocrine Society practice guideline as a level of serum 25-OH vitamin D  less than 20 ng/mL (1,2). The Endocrine Society went on to further define vitamin D  insufficiency as a level between 21 and 29 ng/mL (2). 1. IOM (Institute of Medicine). 2010. Dietary reference    intakes for calcium  and D. [place]: The    [employer]. 2. Holick MF, Binkley Hammon, Bischoff-Ferrari HA, et al.    Evaluation, treatment, and prevention of vitamin D     deficiency: an Endocrine Society clinical practice    guideline. JCEM. 2011 Jul; 96(7):1911-30.          Passed - Ca in normal range and within 360 days    Calcium   Date Value Ref Range Status  09/29/2023 9.4 8.7 - 10.3 mg/dL Final         Passed - Valid encounter within last 12 months    Recent Outpatient Visits           3 months ago Diabetes mellitus treated with insulin  and oral medication (HCC)   [facility] [place]. [facility] Theotis Haze ORN, NP   8 months ago Diabetes mellitus treated with oral medication [facility])   [facility] [place]. [facility] [place], [place] W, NP   11 months ago Type 2 diabetes mellitus with hyperglycemia, without long-term current use of insulin  [facility])   [facility] [place]. [facility] [place], [place] W, NP   11 months ago Type 2 diabetes mellitus with hyperglycemia, without long-term current use of insulin  [facility])   [facility] [place]. [facility], [place] W, NP   1 year ago Type 2 diabetes mellitus with hyperglycemia, without long-term current use of insulin  [facility])   [facility] [place]. [facility] Theotis Haze ORN, [place]

## 2024-01-07 ENCOUNTER — Other Ambulatory Visit: Payer: Self-pay | Admitting: Nurse Practitioner

## 2024-01-07 DIAGNOSIS — I10 Essential (primary) hypertension: Secondary | ICD-10-CM

## 2024-02-09 ENCOUNTER — Encounter: Payer: Self-pay | Admitting: Nurse Practitioner

## 2024-02-09 ENCOUNTER — Ambulatory Visit: Attending: Nurse Practitioner | Admitting: Nurse Practitioner

## 2024-02-09 VITALS — BP 121/74 | HR 67 | Resp 19 | Ht 69.0 in | Wt 218.4 lb

## 2024-02-09 DIAGNOSIS — E1165 Type 2 diabetes mellitus with hyperglycemia: Secondary | ICD-10-CM

## 2024-02-09 DIAGNOSIS — K219 Gastro-esophageal reflux disease without esophagitis: Secondary | ICD-10-CM

## 2024-02-09 DIAGNOSIS — I10 Essential (primary) hypertension: Secondary | ICD-10-CM

## 2024-02-09 DIAGNOSIS — G8929 Other chronic pain: Secondary | ICD-10-CM

## 2024-02-09 DIAGNOSIS — E559 Vitamin D deficiency, unspecified: Secondary | ICD-10-CM

## 2024-02-09 LAB — POCT GLYCOSYLATED HEMOGLOBIN (HGB A1C): Hemoglobin A1C: 7.9 % — AB (ref 4.0–5.6)

## 2024-02-09 MED ORDER — VITAMIN D (ERGOCALCIFEROL) 1.25 MG (50000 UNIT) PO CAPS: 50000.0000 [IU] | ORAL_CAPSULE | ORAL | 0 refills | Status: AC

## 2024-02-09 MED ORDER — LISINOPRIL 10 MG PO TABS: 10.0000 mg | ORAL_TABLET | Freq: Every day | ORAL | 1 refills | Status: AC

## 2024-02-09 MED ORDER — OMEPRAZOLE 20 MG PO CPDR: 20.0000 mg | DELAYED_RELEASE_CAPSULE | Freq: Every day | ORAL | 1 refills | Status: AC

## 2024-02-09 NOTE — Progress Notes (Unsigned)
 Assessment & Plan:  Inara was seen today for diabetes.  Diagnoses and all orders for this visit:  Type 2 diabetes mellitus with hyperglycemia, without long-term current use of insulin  (HCC) -     POCT glycosylated hemoglobin (Hb A1C) -     CMP14+EGFR Improved glycemic control. Hemoglobin A1c decreased from 10% to 7.9%, not yet at target of <7%. - Continue current diabetes management regimen, including insulin  dose. - Reassess A1c in March.   Essential hypertension -     CMP14+EGFR -     lisinopril  (ZESTRIL ) 10 MG tablet; Take 1 tablet (10 mg total) by mouth daily. Continue all antihypertensives as prescribed.  Reminded to bring in blood pressure log for follow  up appointment.  RECOMMENDATIONS: DASH/Mediterranean Diets are healthier choices for HTN.    GERD without esophagitis -     omeprazole  (PRILOSEC) 20 MG capsule; Take 1 capsule (20 mg total) by mouth daily. INSTRUCTIONS: Avoid GERD Triggers: acidic, spicy or fried foods, caffeine, coffee, sodas,  alcohol and chocolate.    Vitamin D  deficiency disease -     VITAMIN D  25 Hydroxy (Vit-D Deficiency, Fractures) -     Vitamin D , Ergocalciferol , (DRISDOL ) 1.25 MG (50000 UNIT) CAPS capsule; Take 1 capsule (50,000 Units total) by mouth every 7 (seven) days. Please fill as a 90 day supply  Chronic pain of right knee -     Ambulatory referral to Physical Therapy Pain persists post-fall, no fracture on x-ray, no swelling. - Referred to physical therapy for knee strengthening exercises. - Consider orthopedist referral if pain persists.   Patient has been counseled on age-appropriate routine health concerns for screening and prevention. These are reviewed and up-to-date. Referrals have been placed accordingly. Immunizations are up-to-date or declined.    Subjective:   Chief Complaint  Patient presents with   Diabetes   History of Present Illness Christina Fields is a 63 year old female with diabetes who presents  for follow-up of her A1c and knee pain.  She is accompanied by her relative today.  VRI was used to communicate directly with patient for the entire encounter including providing detailed patient instructions.    DM 2 Her A1c has improved from 10 to 7.9. She is currently on Lantus  and taking metformin  XR 750 mg daily Lab Results  Component Value Date   HGBA1C 7.9 (A) 02/09/2024    She has been experiencing right knee pain for the past three months following a fall down the stairs. An x-ray at an urgent care facility at the time of the injury showed no fractures. The pain persists, more severe in the right knee compared to the left, and she attributes it to cold weather. She uses ibuprofen  and olive oil for pain relief. No associated swelling.   Review of Systems  Constitutional:  Negative for fever, malaise/fatigue and weight loss.  HENT: Negative.  Negative for nosebleeds.   Eyes: Negative.  Negative for blurred vision, double vision and photophobia.  Respiratory: Negative.  Negative for cough and shortness of breath.   Cardiovascular: Negative.  Negative for chest pain, palpitations and leg swelling.  Gastrointestinal: Negative.  Negative for heartburn, nausea and vomiting.  Musculoskeletal:  Positive for joint pain. Negative for myalgias.  Neurological: Negative.  Negative for dizziness, focal weakness, seizures and headaches.  Psychiatric/Behavioral: Negative.  Negative for suicidal ideas.     Past Medical History:  Diagnosis Date   Diabetes mellitus without complication (HCC)    Hyperlipidemia    Hypertension  Past Surgical History:  Procedure Laterality Date   ANKLE SURGERY  2004   CESAREAN SECTION     x2    Family History  Problem Relation Age of Onset   Diabetes Mother    Breast cancer Niece 56   BRCA 1/2 Neg Hx     Social History Reviewed with no changes to be made today.   Outpatient Medications Prior to Visit  Medication Sig Dispense Refill    Accu-Chek Softclix Lancets lancets Check blood glucose level by fingerstick twice per day. E11.65 100 each 2   atorvastatin  (LIPITOR) 40 MG tablet TAKE 1 TABLET BY MOUTH EVERY DAY 90 tablet 1   Continuous Glucose Sensor (DEXCOM G7 SENSOR) MISC CHECK BLOOD GLUCOSE LEVELS CONTINUOUSLY E11.65 Z79.84 2 each 6   FARXIGA  10 MG TABS tablet TAKE 1 TABLET BY MOUTH EVERY DAY BEFORE BREAKFAST 90 tablet 1   glucose blood (ACCU-CHEK GUIDE) test strip USE TO CHECK BLOOD GLUCOSE BY FINGERSTICK TWICE A DAY 200 strip 1   insulin  glargine (LANTUS ) 100 UNIT/ML Solostar Pen Inject 20 Units into the skin daily at 10 pm. 15 mL 11   meloxicam  (MOBIC ) 7.5 MG tablet Take 1 tablet (7.5 mg total) by mouth daily. 30 tablet 6   metFORMIN  (GLUCOPHAGE -XR) 750 MG 24 hr tablet TAKE 1 TABLET BY MOUTH EVERY DAY WITH BREAKFAST 90 tablet 1   cyclobenzaprine  (FLEXERIL ) 5 MG tablet Take 1 tablet (5 mg total) by mouth at bedtime as needed for muscle spasms. 30 tablet 3   lisinopril  (ZESTRIL ) 10 MG tablet TAKE 1 TABLET BY MOUTH EVERY DAY 90 tablet 0   omeprazole  (PRILOSEC) 20 MG capsule TAKE 1 CAPSULE BY MOUTH EVERY DAY 90 capsule 0   Vitamin D , Ergocalciferol , (DRISDOL ) 1.25 MG (50000 UNIT) CAPS capsule Take 1 capsule (50,000 Units total) by mouth every 7 (seven) days. Please fill as a 90 day supply (Patient not taking: Reported on 02/09/2024) 12 capsule 0   No facility-administered medications prior to visit.    No Known Allergies     Objective:    BP 121/74 (BP Location: Left Arm, Patient Position: Sitting, Cuff Size: Normal)   Pulse 67   Resp 19   Ht 5' 9 (1.753 m)   Wt 218 lb 6.4 oz (99.1 kg)   LMP  (LMP Unknown)   SpO2 98%   BMI 32.25 kg/m  Wt Readings from Last 3 Encounters:  02/09/24 218 lb 6.4 oz (99.1 kg)  09/29/23 213 lb 6.4 oz (96.8 kg)  05/07/23 224 lb (101.6 kg)    Physical Exam Vitals and nursing note reviewed.  Constitutional:      Appearance: She is well-developed.  HENT:     Head: Normocephalic and  atraumatic.  Cardiovascular:     Rate and Rhythm: Normal rate and regular rhythm.     Heart sounds: Normal heart sounds. No murmur heard.    No friction rub. No gallop.  Pulmonary:     Effort: Pulmonary effort is normal. No tachypnea or respiratory distress.     Breath sounds: Normal breath sounds. No decreased breath sounds, wheezing, rhonchi or rales.  Chest:     Chest wall: No tenderness.  Musculoskeletal:        General: Normal range of motion.     Cervical back: Normal range of motion.     Right knee: Swelling present.     Left knee: Normal.  Skin:    General: Skin is warm and dry.  Neurological:  Mental Status: She is alert and oriented to person, place, and time.     Coordination: Coordination normal.  Psychiatric:        Behavior: Behavior normal. Behavior is cooperative.        Thought Content: Thought content normal.        Judgment: Judgment normal.          Patient has been counseled extensively about nutrition and exercise as well as the importance of adherence with medications and regular follow-up. The patient was given clear instructions to go to ER or return to medical center if symptoms don't improve, worsen or new problems develop. The patient verbalized understanding.   Follow-up: Return in about 3 months (around 05/12/2024).   Haze LELON Servant, FNP-BC [facility] [place], [place] [phone]   02/14/2024, 11:46 AM

## 2024-02-10 LAB — CMP14+EGFR
ALT: 17 IU/L (ref 0–32)
AST: 20 IU/L (ref 0–40)
Albumin: 4.1 g/dL (ref 3.9–4.9)
Alkaline Phosphatase: 65 IU/L (ref 49–135)
BUN/Creatinine Ratio: 33 — ABNORMAL HIGH (ref 12–28)
BUN: 20 mg/dL (ref 8–27)
Bilirubin Total: 0.2 mg/dL (ref 0.0–1.2)
CO2: 23 mmol/L (ref 20–29)
Calcium: 9.3 mg/dL (ref 8.7–10.3)
Chloride: 105 mmol/L (ref 96–106)
Creatinine, Ser: 0.61 mg/dL (ref 0.57–1.00)
Globulin, Total: 2.8 g/dL (ref 1.5–4.5)
Glucose: 169 mg/dL — ABNORMAL HIGH (ref 70–99)
Potassium: 4.2 mmol/L (ref 3.5–5.2)
Sodium: 140 mmol/L (ref 134–144)
Total Protein: 6.9 g/dL (ref 6.0–8.5)
eGFR: 100 mL/min/1.73 (ref >59)

## 2024-02-10 LAB — VITAMIN D 25 HYDROXY (VIT D DEFICIENCY, FRACTURES): Vit D, 25-Hydroxy: 27.8 ng/mL — ABNORMAL LOW (ref 30.0–100.0)

## 2024-02-13 ENCOUNTER — Ambulatory Visit: Payer: Self-pay | Admitting: Nurse Practitioner

## 2024-02-14 ENCOUNTER — Encounter: Payer: Self-pay | Admitting: Nurse Practitioner

## 2024-04-12 ENCOUNTER — Other Ambulatory Visit: Payer: Self-pay | Admitting: Nurse Practitioner

## 2024-04-12 DIAGNOSIS — E1169 Type 2 diabetes mellitus with other specified complication: Secondary | ICD-10-CM

## 2024-05-15 ENCOUNTER — Ambulatory Visit: Admitting: Nurse Practitioner

## 2024-05-30 ENCOUNTER — Ambulatory Visit: Admitting: Nurse Practitioner
